# Patient Record
Sex: Female | Born: 1970 | Race: White | Hispanic: No | Marital: Single | Smoking: Former smoker
Health system: Southern US, Community
[De-identification: ages and names within clinical notes are randomized; demographics above are authoritative.]

## PROBLEM LIST (undated history)

## (undated) DIAGNOSIS — T783XXA Angioneurotic edema, initial encounter: Secondary | ICD-10-CM

## (undated) DIAGNOSIS — J302 Other seasonal allergic rhinitis: Secondary | ICD-10-CM

## (undated) DIAGNOSIS — J45909 Unspecified asthma, uncomplicated: Secondary | ICD-10-CM

## (undated) HISTORY — PX: TONSILLECTOMY: SUR1361

## (undated) HISTORY — PX: ABDOMINAL HYSTERECTOMY: SHX81

## (undated) HISTORY — DX: Angioneurotic edema, initial encounter: T78.3XXA

## (undated) HISTORY — PX: SHOULDER SURGERY: SHX246

## (undated) HISTORY — PX: ADENOIDECTOMY: SUR15

## (undated) HISTORY — PX: WISDOM TOOTH EXTRACTION: SHX21

---

## 2014-08-13 ENCOUNTER — Emergency Department (HOSPITAL_BASED_OUTPATIENT_CLINIC_OR_DEPARTMENT_OTHER)
Admission: EM | Admit: 2014-08-13 | Discharge: 2014-08-13 | Disposition: A | Discharge status: Home / Self Care | Payer: BLUE CROSS/BLUE SHIELD | Attending: Emergency Medicine | Admitting: Emergency Medicine

## 2014-08-13 ENCOUNTER — Encounter (HOSPITAL_BASED_OUTPATIENT_CLINIC_OR_DEPARTMENT_OTHER): Payer: Self-pay | Admitting: *Deleted

## 2014-08-13 DIAGNOSIS — N390 Urinary tract infection, site not specified: Secondary | ICD-10-CM | POA: Insufficient documentation

## 2014-08-13 DIAGNOSIS — Z720 Tobacco use: Secondary | ICD-10-CM | POA: Diagnosis not present

## 2014-08-13 DIAGNOSIS — R109 Unspecified abdominal pain: Secondary | ICD-10-CM | POA: Diagnosis present

## 2014-08-13 LAB — URINALYSIS, ROUTINE W REFLEX MICROSCOPIC
Bilirubin Urine: NEGATIVE
Glucose, UA: NEGATIVE mg/dL
HGB URINE DIPSTICK: NEGATIVE
Ketones, ur: NEGATIVE mg/dL
Nitrite: POSITIVE — AB
PROTEIN: NEGATIVE mg/dL
SPECIFIC GRAVITY, URINE: 1.015 (ref 1.005–1.030)
UROBILINOGEN UA: 0.2 mg/dL (ref 0.0–1.0)
pH: 7 (ref 5.0–8.0)

## 2014-08-13 LAB — URINE MICROSCOPIC-ADD ON

## 2014-08-13 MED ORDER — PHENAZOPYRIDINE HCL 200 MG PO TABS: 200.0000 mg | ORAL_TABLET | Freq: Three times a day (TID) | ORAL | Status: DC

## 2014-08-13 MED ORDER — NAPROXEN 375 MG PO TABS: 375.0000 mg | ORAL_TABLET | Freq: Two times a day (BID) | ORAL | Status: DC

## 2014-08-13 MED ORDER — NAPROXEN 250 MG PO TABS
500.0000 mg | ORAL_TABLET | Freq: Once | ORAL | Status: AC
  Administered 2014-08-13: 500 mg via ORAL
  Filled 2014-08-13: qty 2

## 2014-08-13 MED ORDER — PHENAZOPYRIDINE HCL 100 MG PO TABS
200.0000 mg | ORAL_TABLET | Freq: Once | ORAL | Status: AC
  Administered 2014-08-13: 200 mg via ORAL
  Filled 2014-08-13: qty 2

## 2014-08-13 MED ORDER — NITROFURANTOIN MONOHYD MACRO 100 MG PO CAPS: 100.0000 mg | ORAL_CAPSULE | Freq: Two times a day (BID) | ORAL | Status: DC

## 2014-08-13 MED ORDER — NITROFURANTOIN MONOHYD MACRO 100 MG PO CAPS
100.0000 mg | ORAL_CAPSULE | Freq: Once | ORAL | Status: AC
  Administered 2014-08-13: 100 mg via ORAL
  Filled 2014-08-13: qty 1

## 2014-08-13 NOTE — ED Provider Notes (Signed)
CSN: [id number]     Arrival date & time 08/13/14  2146 History  This chart was scribed for Giavana Rooke, MD by Annye AsaAnna Dorsett, ED Scribe. This patient was seen in room MH10/MH10 and the patient's care was started at 11:26 PM.    Chief Complaint  Patient presents with  . Flank Pain   Patient is a 44 y.o. female presenting with flank pain. The history is provided by the patient. No language interpreter was used.  Flank Pain This is a new problem. The current episode started 12 to 24 hours ago. The problem occurs rarely. The problem has not changed since onset.Pertinent negatives include no chest pain, no abdominal pain, no headaches and no shortness of breath. Nothing aggravates the symptoms. Nothing relieves the symptoms. She has tried nothing for the symptoms. The treatment provided no relief.     HPI Comments: Heather Weiss is a 44 y.o. female who presents to the Emergency Department complaining of 1 day left flank pain (points to LL back at posterior iliac crest) beginning today. She reports a malodorous scent to her urine, described as "protein-like." She denies any dysuria, hematuria or increased frequency. She denies any vaginal discharge.    She does not wear contacts.   History reviewed. No pertinent past medical history. Past Surgical History  Procedure Laterality Date  . Abdominal hysterectomy    . Tonsillectomy     No family history on file. History  Substance Use Topics  . Smoking status: Current Every Day Smoker -- 1.00 packs/day    Types: Cigarettes  . Smokeless tobacco: Not on file  . Alcohol Use: No   OB History    No data available     Review of Systems  Constitutional: Negative for fever.  Respiratory: Negative for shortness of breath.   Cardiovascular: Negative for chest pain.  Gastrointestinal: Negative for vomiting, abdominal pain and constipation.  Genitourinary: Positive for flank pain. Negative for dysuria, frequency, hematuria and vaginal discharge.   Neurological: Negative for headaches.  All other systems reviewed and are negative.  Allergies  Cyclinex and Sulfa antibiotics  Home Medications   Prior to Admission medications   Not on File   BP 139/74 mmHg  Pulse 92  Temp(Src) 98.8 F (37.1 C) (Oral)  Resp 20  Ht 5\' 5"  (1.651 m)  Wt 179 lb (81.194 kg)  BMI 29.79 kg/m2  SpO2 98% Physical Exam  Constitutional: She is oriented to person, place, and time. She appears well-developed and well-nourished. No distress.  Well appearing  HENT:  Head: Normocephalic and atraumatic.  Mouth/Throat: Oropharynx is clear and moist. No oropharyngeal exudate.  Moist mucous membranes  Eyes: EOM are normal. Pupils are equal, round, and reactive to light.  Neck: Normal range of motion. Neck supple. No JVD present.  Cardiovascular: Normal rate, regular rhythm and normal heart sounds.  Exam reveals no gallop and no friction rub.   No murmur heard. Pulmonary/Chest: Effort normal and breath sounds normal. No respiratory distress. She has no wheezes. She has no rales.  Abdominal: Soft. Bowel sounds are normal. She exhibits no mass. There is no tenderness. There is no rebound and no guarding.  Musculoskeletal: Normal range of motion. She exhibits no edema.  Moves all extremities normally.   Lymphadenopathy:    She has no cervical adenopathy.  Neurological: She is alert and oriented to person, place, and time. She has normal reflexes. She displays normal reflexes.  Skin: Skin is warm and dry. No rash noted.  Psychiatric: She  has a normal mood and affect. Her behavior is normal.  Nursing note and vitals reviewed.   ED Course  Procedures   DIAGNOSTIC STUDIES: Oxygen Saturation is 98% on RA, normal by my interpretation.    COORDINATION OF CARE: 11:29 PM Discussed treatment plan with pt at bedside and pt agreed to plan.   Labs Review Labs Reviewed  URINALYSIS, ROUTINE W REFLEX MICROSCOPIC - Abnormal; Notable for the following:     APPearance CLOUDY (*)    Nitrite POSITIVE (*)    Leukocytes, UA MODERATE (*)    All other components within normal limits  URINE MICROSCOPIC-ADD ON - Abnormal; Notable for the following:    Squamous Epithelial / LPF FEW (*)    Bacteria, UA MANY (*)    All other components within normal limits    Imaging Review No results found.   EKG Interpretation None      MDM   Final diagnoses:  None   Urine is consistent with UTI. Will treat with 7 days of macrobid given it's side effect profile and low resistance in the community.  Pyridium for bladder spasm and pain medication.  Strict return precautions for fever, intractable vomiting, inability to tolerate orals or any concerns.    I personally performed the services described in this documentation, which was scribed in my presence. The recorded information has been reviewed and is accurate.       Cy Blamer, MD 08/14/14 [phone]

## 2014-08-13 NOTE — ED Notes (Signed)
Left flank pain and nausea. Sore throat. Cough.

## 2014-08-14 ENCOUNTER — Encounter (HOSPITAL_BASED_OUTPATIENT_CLINIC_OR_DEPARTMENT_OTHER): Payer: Self-pay | Admitting: Emergency Medicine

## 2015-03-12 ENCOUNTER — Emergency Department (HOSPITAL_BASED_OUTPATIENT_CLINIC_OR_DEPARTMENT_OTHER)
Admission: EM | Admit: 2015-03-12 | Discharge: 2015-03-12 | Disposition: A | Discharge status: Home / Self Care | Payer: BLUE CROSS/BLUE SHIELD | Attending: Emergency Medicine | Admitting: Emergency Medicine

## 2015-03-12 ENCOUNTER — Encounter (HOSPITAL_BASED_OUTPATIENT_CLINIC_OR_DEPARTMENT_OTHER): Payer: Self-pay

## 2015-03-12 DIAGNOSIS — J45909 Unspecified asthma, uncomplicated: Secondary | ICD-10-CM | POA: Insufficient documentation

## 2015-03-12 DIAGNOSIS — L03211 Cellulitis of face: Secondary | ICD-10-CM | POA: Diagnosis not present

## 2015-03-12 DIAGNOSIS — F17210 Nicotine dependence, cigarettes, uncomplicated: Secondary | ICD-10-CM | POA: Diagnosis not present

## 2015-03-12 DIAGNOSIS — Z79899 Other long term (current) drug therapy: Secondary | ICD-10-CM | POA: Insufficient documentation

## 2015-03-12 DIAGNOSIS — R51 Headache: Secondary | ICD-10-CM | POA: Diagnosis present

## 2015-03-12 HISTORY — DX: Other seasonal allergic rhinitis: J30.2

## 2015-03-12 HISTORY — DX: Unspecified asthma, uncomplicated: J45.909

## 2015-03-12 MED ORDER — CEPHALEXIN 500 MG PO CAPS: 500.0000 mg | ORAL_CAPSULE | Freq: Four times a day (QID) | ORAL | Status: DC

## 2015-03-12 MED ORDER — CYCLOBENZAPRINE HCL 10 MG PO TABS: 10.0000 mg | ORAL_TABLET | Freq: Two times a day (BID) | ORAL | Status: DC | PRN

## 2015-03-12 MED ORDER — OXYCODONE-ACETAMINOPHEN 5-325 MG PO TABS: 2.0000 | ORAL_TABLET | ORAL | Status: DC | PRN

## 2015-03-12 MED ORDER — TRAMADOL HCL 50 MG PO TABS: 50.0000 mg | ORAL_TABLET | Freq: Four times a day (QID) | ORAL | Status: DC | PRN

## 2015-03-12 NOTE — Discharge Instructions (Signed)
Cellulitis Cellulitis is an infection of the skin and the tissue beneath it. The infected area is usually red and tender. Cellulitis occurs most often in the arms and lower legs.  CAUSES  Cellulitis is caused by bacteria that enter the skin through cracks or cuts in the skin. The most common types of bacteria that cause cellulitis are staphylococci and streptococci. SIGNS AND SYMPTOMS   Redness and warmth.  Swelling.  Tenderness or pain.  Fever. DIAGNOSIS  Your health care provider can usually determine what is wrong based on a physical exam. Blood tests may also be done. TREATMENT  Treatment usually involves taking an antibiotic medicine. HOME CARE INSTRUCTIONS   Take your antibiotic medicine as directed by your health care provider. Finish the antibiotic even if you start to feel better.  Keep the infected arm or leg elevated to reduce swelling.  Apply a warm cloth to the affected area up to 4 times per day to relieve pain.  Take medicines only as directed by your health care provider.  Keep all follow-up visits as directed by your health care provider. SEEK MEDICAL CARE IF:   You notice red streaks coming from the infected area.  Your red area gets larger or turns dark in color.  Your bone or joint underneath the infected area becomes painful after the skin has healed.  Your infection returns in the same area or another area.  You notice a swollen bump in the infected area.  You develop new symptoms.  You have a fever. SEEK IMMEDIATE MEDICAL CARE IF:   You feel very sleepy.  You develop vomiting or diarrhea.  You have a general ill feeling (malaise) with muscle aches and pains.   This information is not intended to replace advice given to you by your health care provider. Make sure you discuss any questions you have with your health care provider.  Follow up with your primary care provider if symptoms do not improve. Take antibiotics as prescribed. Encourage  warm compresses. Return to the ED if you experience worsening of your pain, spread of the redness or warmth, increased facial swelling.

## 2015-03-12 NOTE — ED Notes (Signed)
Nurse aware of BP 

## 2015-03-12 NOTE — ED Notes (Signed)
Reports right sided facial pain that started 3 days ago.  Some swelling noted.  Reports no dental pain.

## 2015-03-13 NOTE — ED Provider Notes (Signed)
CSN: [id number]     Arrival date & time 03/12/15  1717 History   First MD Initiated Contact with Patient 03/12/15 1842     Chief Complaint  Patient presents with  . Facial Pain     (Consider location/radiation/quality/duration/timing/severity/associated sxs/prior Treatment) HPI   Heather Weiss is a 44 y.o F with no significant pmhx who presents to the ED today c/o R sided facial pain. Pt states that 3 days ago she noticed a pimple on the R side of her face on her cheek bone that she attempted to pop. Since then the area has become very swollen and red. She now has pain extending to her R ear, R jaw and R side of her nose. No active drainage. She has been applying warm compresses and alcohol with no relief. Denies fever, chills, pain with eye movement, trismus, dental pain, odynophagia, vomiting, otalgia.   Past Medical History  Diagnosis Date  . Asthma   . Seasonal allergies    Past Surgical History  Procedure Laterality Date  . Abdominal hysterectomy    . Tonsillectomy     No family history on file. Social History  Substance Use Topics  . Smoking status: Current Every Day Smoker -- 1.00 packs/day    Types: Cigarettes  . Smokeless tobacco: None  . Alcohol Use: No   OB History    No data available     Review of Systems  All other systems reviewed and are negative.     Allergies  Cyclinex and Sulfa antibiotics  Home Medications   Prior to Admission medications   Medication Sig Start Date End Date Taking? Authorizing Provider  cephALEXin (KEFLEX) 500 MG capsule Take 1 capsule (500 mg total) by mouth 4 (four) times daily. 03/12/15   Samantha Tripp Dowless, PA-C  cyclobenzaprine (FLEXERIL) 10 MG tablet Take 1 tablet (10 mg total) by mouth 2 (two) times daily as needed for muscle spasms. 03/12/15   Samantha Tripp Dowless, PA-C  naproxen (NAPROSYN) 375 MG tablet Take 1 tablet (375 mg total) by mouth 2 (two) times daily. 08/13/14   April Palumbo, MD  nitrofurantoin,  macrocrystal-monohydrate, (MACROBID) 100 MG capsule Take 1 capsule (100 mg total) by mouth 2 (two) times daily. X 7 days 08/13/14   April Palumbo, MD  oxyCODONE-acetaminophen (PERCOCET/ROXICET) 5-325 MG tablet Take 2 tablets by mouth every 4 (four) hours as needed for severe pain. 03/12/15   Samantha Tripp Dowless, PA-C  phenazopyridine (PYRIDIUM) 200 MG tablet Take 1 tablet (200 mg total) by mouth 3 (three) times daily. 08/13/14   April Palumbo, MD  traMADol (ULTRAM) 50 MG tablet Take 1 tablet (50 mg total) by mouth every 6 (six) hours as needed. 03/12/15   Samantha Tripp Dowless, PA-C   BP 110/48 mmHg  Pulse 65  Temp(Src) 98.4 F (36.9 C) (Oral)  Resp 20  Ht '5\' 5"'  (1.651 m)  Wt 83.915 kg  BMI 30.79 kg/m2  SpO2 100% Physical Exam  Constitutional: She is oriented to person, place, and time. She appears well-developed and well-nourished. No distress.  HENT:  Head: Normocephalic and atraumatic.  Mouth/Throat: Oropharynx is clear and moist. No oropharyngeal exudate.  TM clear bilaterally.   2cm area of red, indurated tissue on R cheek bone. No fluctuance. No open sore or active drainage. No warmth. TTP over R cheek bone.  No dental abscess. No trismus. No dental caries. Uvula midline. No swelling under tongue.   Eyes: Conjunctivae are normal. Right eye exhibits no discharge. Left eye exhibits no  discharge. No scleral icterus.  Neck: Neck supple.  Cardiovascular: Normal rate.   Pulmonary/Chest: Effort normal.  Lymphadenopathy:    She has no cervical adenopathy.  Neurological: She is alert and oriented to person, place, and time. Coordination normal.  Skin: Skin is warm and dry. No rash noted. She is not diaphoretic. No erythema. No pallor.  Psychiatric: She has a normal mood and affect. Her behavior is normal.  Nursing note and vitals reviewed.   ED Course  Procedures (including critical care time) Labs Review Labs Reviewed - No data to display  Imaging Review No results found. I  have personally reviewed and evaluated these images and lab results as part of my medical decision-making.   EKG Interpretation None      MDM   Final diagnoses:  Cellulitis of face    Pt presents with cellulitis on face. Bedside US performed, no obvious fluid collection, no drainable abscess. Pt afebrile. No SIRS criteria met. Will d/c home with abx. Follow up with PCP. Return precautions outlined in patient discharge instructions.   Patient was discussed with and seen by Dr. Jeneen Rinks who agrees with the treatment plan.       Dondra Spry [place], PA-C 03/13/15 [place], MD 03/25/15 [phone]

## 2015-08-02 ENCOUNTER — Emergency Department (HOSPITAL_BASED_OUTPATIENT_CLINIC_OR_DEPARTMENT_OTHER)
Admission: EM | Admit: 2015-08-02 | Discharge: 2015-08-02 | Disposition: A | Discharge status: Home / Self Care | Payer: BLUE CROSS/BLUE SHIELD | Attending: Emergency Medicine | Admitting: Emergency Medicine

## 2015-08-02 ENCOUNTER — Emergency Department (HOSPITAL_BASED_OUTPATIENT_CLINIC_OR_DEPARTMENT_OTHER): Payer: BLUE CROSS/BLUE SHIELD

## 2015-08-02 ENCOUNTER — Encounter (HOSPITAL_BASED_OUTPATIENT_CLINIC_OR_DEPARTMENT_OTHER): Payer: Self-pay | Admitting: *Deleted

## 2015-08-02 DIAGNOSIS — F17210 Nicotine dependence, cigarettes, uncomplicated: Secondary | ICD-10-CM | POA: Insufficient documentation

## 2015-08-02 DIAGNOSIS — Z79899 Other long term (current) drug therapy: Secondary | ICD-10-CM | POA: Insufficient documentation

## 2015-08-02 DIAGNOSIS — J45909 Unspecified asthma, uncomplicated: Secondary | ICD-10-CM | POA: Insufficient documentation

## 2015-08-02 DIAGNOSIS — R05 Cough: Secondary | ICD-10-CM | POA: Diagnosis present

## 2015-08-02 DIAGNOSIS — J069 Acute upper respiratory infection, unspecified: Secondary | ICD-10-CM

## 2015-08-02 MED ORDER — AZITHROMYCIN 250 MG PO TABS: 250.0000 mg | ORAL_TABLET | Freq: Every day | ORAL | Status: DC

## 2015-08-02 MED ORDER — ALBUTEROL SULFATE HFA 108 (90 BASE) MCG/ACT IN AERS: 1.0000 | INHALATION_SPRAY | Freq: Four times a day (QID) | RESPIRATORY_TRACT | Status: DC | PRN

## 2015-08-02 MED ORDER — BENZONATATE 100 MG PO CAPS: 100.0000 mg | ORAL_CAPSULE | Freq: Three times a day (TID) | ORAL | Status: DC

## 2015-08-02 NOTE — ED Provider Notes (Signed)
CSN: [id number]     Arrival date & time 08/02/15  1455 History   First MD Initiated Contact with Patient 08/02/15 1556     Chief Complaint  Patient presents with  . Cough  . Sore Throat    (Consider location/radiation/quality/duration/timing/severity/associated sxs/prior Treatment) Patient is a 45 y.o. female presenting with cough and pharyngitis. The history is provided by the patient and medical records. No language interpreter was used.  Cough Associated symptoms: shortness of breath, sore throat and wheezing   Associated symptoms: no chest pain, no chills, no headaches, no myalgias and no rash   Sore Throat Associated symptoms include congestion, coughing and a sore throat. Pertinent negatives include no abdominal pain, chest pain, chills, headaches, myalgias, nausea, neck pain, rash or vomiting.   Heather Weiss is a 45 y.o. female  with a PMH of asthma who presents to the Emergency Department complaining of productive cough 2-3 days. Associated symptoms include nasal congestion, shortness of breath with exertion, sore throat. Unsure about fever. States that she was wheezing yesterday, no wheezing today. Albuterol inhaler taken with little relief-of note, patient states she received this inhaler back in 2013 and is unsure if it is still effective. No other medications taken prior to arrival. Patient is a current every day smoker. Denies chest pain, abdominal pain. No aggravating or alleviating factors noted.   Past Medical History  Diagnosis Date  . Asthma   . Seasonal allergies    Past Surgical History  Procedure Laterality Date  . Abdominal hysterectomy    . Tonsillectomy     No family history on file. Social History  Substance Use Topics  . Smoking status: Current Every Day Smoker -- 1.00 packs/day    Types: Cigarettes  . Smokeless tobacco: Never Used  . Alcohol Use: No   OB History    No data available     Review of Systems  Constitutional: Negative for chills.   HENT: Positive for congestion and sore throat.   Eyes: Negative for visual disturbance.  Respiratory: Positive for cough, shortness of breath and wheezing.   Cardiovascular: Negative.  Negative for chest pain.  Gastrointestinal: Negative for nausea, vomiting and abdominal pain.  Genitourinary: Negative for dysuria.  Musculoskeletal: Negative for myalgias and neck pain.  Skin: Negative for rash.  Neurological: Negative for dizziness and headaches.      Allergies  Cyclinex and Sulfa antibiotics  Home Medications   Prior to Admission medications   Medication Sig Start Date End Date Taking? Authorizing Provider  diphenhydrAMINE (SOMINEX) 25 MG tablet Take 25 mg by mouth at bedtime as needed for sleep.   Yes Historical Provider, MD  albuterol (PROVENTIL HFA;VENTOLIN HFA) 108 (90 Base) MCG/ACT inhaler Inhale 1-2 puffs into the lungs every 6 (six) hours as needed for wheezing or shortness of breath. 08/02/15   Chase Picket Jonathon Castelo, PA-C  azithromycin (ZITHROMAX) 250 MG tablet Take 1 tablet (250 mg total) by mouth daily. Take first 2 tablets together, then 1 every day until finished. 08/02/15   Froilan Mclean Pilcher Riyaan Heroux, PA-C  benzonatate (TESSALON) 100 MG capsule Take 1 capsule (100 mg total) by mouth every 8 (eight) hours. 08/02/15   Chase Picket Blease Capaldi, PA-C  cephALEXin (KEFLEX) 500 MG capsule Take 1 capsule (500 mg total) by mouth 4 (four) times daily. 03/12/15   Samantha Tripp Dowless, PA-C  cyclobenzaprine (FLEXERIL) 10 MG tablet Take 1 tablet (10 mg total) by mouth 2 (two) times daily as needed for muscle spasms. 03/12/15   Dub Mikes,  PA-C  naproxen (NAPROSYN) 375 MG tablet Take 1 tablet (375 mg total) by mouth 2 (two) times daily. 08/13/14   April Palumbo, MD  nitrofurantoin, macrocrystal-monohydrate, (MACROBID) 100 MG capsule Take 1 capsule (100 mg total) by mouth 2 (two) times daily. X 7 days 08/13/14   April Palumbo, MD  oxyCODONE-acetaminophen (PERCOCET/ROXICET) 5-325 MG tablet Take 2  tablets by mouth every 4 (four) hours as needed for severe pain. 03/12/15   Samantha Tripp Dowless, PA-C  phenazopyridine (PYRIDIUM) 200 MG tablet Take 1 tablet (200 mg total) by mouth 3 (three) times daily. 08/13/14   April Palumbo, MD  traMADol (ULTRAM) 50 MG tablet Take 1 tablet (50 mg total) by mouth every 6 (six) hours as needed. 03/12/15   Samantha Tripp Dowless, PA-C   BP 113/57 mmHg  Pulse 87  Temp(Src) 99.2 F (37.3 C)  Resp 18  Ht 5\' 5"  (1.651 m)  Wt 84.879 kg  BMI 31.14 kg/m2  SpO2 100% Physical Exam  Constitutional: She is oriented to person, place, and time. She appears well-developed and well-nourished. No distress.  Alert and in no acute distress  HENT:  Head: Normocephalic and atraumatic.  OP with erythema, no exudates or tonsillar hypertrophy. + nasal congestion with mucosal edema.   Neck: Normal range of motion. Neck supple.  No meningeal signs.   Cardiovascular: Normal rate, regular rhythm, normal heart sounds and intact distal pulses.  Exam reveals no gallop and no friction rub.   No murmur heard. Pulmonary/Chest: Effort normal and breath sounds normal. No respiratory distress. She has no wheezes. She has no rales. She exhibits no tenderness.  Lungs are clear to auscultation bilaterally - no w/r/r  Abdominal: Soft. Bowel sounds are normal. She exhibits no distension and no mass. There is no tenderness. There is no rebound and no guarding.  Musculoskeletal: Normal range of motion. She exhibits no edema.  Neurological: She is alert and oriented to person, place, and time.  Skin: Skin is warm and dry. She is not diaphoretic.  Nursing note and vitals reviewed.  ED Course  Procedures (including critical care time) Labs Review Labs Reviewed - No data to display  Imaging Review Dg Chest 2 View  08/02/2015  CLINICAL DATA:  Productive cough, wheezing, shortness of breath, fever and chest heaviness 2 days. EXAM: CHEST  2 VIEW COMPARISON:  None. FINDINGS: Lungs are  adequately inflated and otherwise clear. Cardiomediastinal silhouette is within normal. There are mild degenerative changes of the spine. IMPRESSION: No active cardiopulmonary disease. Electronically Signed   By: Elberta Fortisaniel  Boyle M.D.   On: 08/02/2015 16:22   I have personally reviewed and evaluated these images and lab results as part of my medical decision-making.   EKG Interpretation None      MDM   Final diagnoses:  URI (upper respiratory infection)   Heather Weiss presents to ED for cough/congestion/sob. History of asthma and current every day smoker. Chest x-ray obtained which was unremarkable. On exam, oropharynx with erythema, but no exudates or hypertrophy. Temp of 99.2. Other vitals are reassuring. Given smoking status, will go ahead and treat with azithromycin. Tessalon for cough, refill home albuterol inhaler. Importance of smoking cessation was discussed. PCP follow-up strongly encouraged. Return precautions given and all questions answered.  [facility] Grandview HospitalJaime Pilcher Seon Gaertner, PA-C 08/02/15 1645  Arby BarretteMarcy Pfeiffer, MD 08/02/15 [phone]

## 2015-08-02 NOTE — ED Notes (Signed)
Pt given d/c instructions as per chart. Rx x 2. Verbalizes understanding. No questions. 

## 2015-08-02 NOTE — Discharge Instructions (Signed)
1. Medications: Please take all of your antibiotics until finished! Tessalon for cough, you can use mucinex for nasal congestion as well, usual home medications 2. Treatment: rest, drink plenty of fluids, take tylenol or ibuprofen for fever control if needed, quit smoking 3. Follow Up: Please follow up with your primary doctor in 1 week if symptoms do not improve for discussion of your diagnoses and further evaluation after today's visit; Return to the ER for high fevers, difficulty breathing or other concerning symptoms

## 2015-08-02 NOTE — ED Notes (Signed)
Presents with cough and congestion for the past several days, states progressively worse, used Albuterol Inhaler yesterday, some relief.

## 2015-08-02 NOTE — ED Notes (Signed)
Having problems sleeping secondary to cough and wheezing

## 2015-08-02 NOTE — ED Notes (Signed)
Pt reports 3 days of cough, SOB, sore throat. Used inhaler yesterday without relief

## 2015-08-02 NOTE — ED Notes (Signed)
Pt reports the inhaler is older and she's not sure if it is effective

## 2016-04-29 ENCOUNTER — Emergency Department (HOSPITAL_BASED_OUTPATIENT_CLINIC_OR_DEPARTMENT_OTHER)
Admission: EM | Admit: 2016-04-29 | Discharge: 2016-04-29 | Disposition: A | Discharge status: Home / Self Care | Payer: BLUE CROSS/BLUE SHIELD | Attending: Emergency Medicine | Admitting: Emergency Medicine

## 2016-04-29 ENCOUNTER — Encounter (HOSPITAL_BASED_OUTPATIENT_CLINIC_OR_DEPARTMENT_OTHER): Payer: Self-pay | Admitting: *Deleted

## 2016-04-29 DIAGNOSIS — F17210 Nicotine dependence, cigarettes, uncomplicated: Secondary | ICD-10-CM | POA: Insufficient documentation

## 2016-04-29 DIAGNOSIS — X501XXA Overexertion from prolonged static or awkward postures, initial encounter: Secondary | ICD-10-CM | POA: Diagnosis not present

## 2016-04-29 DIAGNOSIS — Y929 Unspecified place or not applicable: Secondary | ICD-10-CM | POA: Insufficient documentation

## 2016-04-29 DIAGNOSIS — J45909 Unspecified asthma, uncomplicated: Secondary | ICD-10-CM | POA: Diagnosis not present

## 2016-04-29 DIAGNOSIS — Y999 Unspecified external cause status: Secondary | ICD-10-CM | POA: Insufficient documentation

## 2016-04-29 DIAGNOSIS — Z79899 Other long term (current) drug therapy: Secondary | ICD-10-CM | POA: Diagnosis not present

## 2016-04-29 DIAGNOSIS — S39012A Strain of muscle, fascia and tendon of lower back, initial encounter: Secondary | ICD-10-CM

## 2016-04-29 DIAGNOSIS — S3992XA Unspecified injury of lower back, initial encounter: Secondary | ICD-10-CM | POA: Diagnosis present

## 2016-04-29 DIAGNOSIS — Y9389 Activity, other specified: Secondary | ICD-10-CM | POA: Insufficient documentation

## 2016-04-29 LAB — URINALYSIS, ROUTINE W REFLEX MICROSCOPIC
Bilirubin Urine: NEGATIVE
Glucose, UA: NEGATIVE mg/dL
Hgb urine dipstick: NEGATIVE
Ketones, ur: NEGATIVE mg/dL
Leukocytes, UA: NEGATIVE
Nitrite: NEGATIVE
PH: 7 (ref 5.0–8.0)
Protein, ur: NEGATIVE mg/dL
Specific Gravity, Urine: 1.016 (ref 1.005–1.030)

## 2016-04-29 MED ORDER — METHOCARBAMOL 500 MG PO TABS: 500.0000 mg | ORAL_TABLET | Freq: Four times a day (QID) | ORAL | 0 refills | Status: DC

## 2016-04-29 NOTE — ED Triage Notes (Addendum)
Lower back pain x 3 days. Sharp pain after getting out of the shower and using her towel to dry off.

## 2016-04-29 NOTE — Discharge Instructions (Signed)
Please read and follow all provided instructions.  Your diagnoses today include:  1. Strain of lumbar region, initial encounter    Tests performed today include:  Vital signs - see below for your results today  Medications prescribed:   Robaxin (methocarbamol) - muscle relaxer medication  DO NOT drive or perform any activities that require you to be awake and alert because this medicine can make you drowsy.   Take any prescribed medications only as directed.  Home care instructions:   Follow any educational materials contained in this packet  Please rest, use ice or heat on your back for the next several days  Do not lift, push, pull anything more than 10 pounds for the next week  Follow-up instructions: Please follow-up with your primary care provider in the next 1 week for further evaluation of your symptoms.   Return instructions:  SEEK IMMEDIATE MEDICAL ATTENTION IF YOU HAVE:  New numbness, tingling, weakness, or problem with the use of your arms or legs  Severe back pain not relieved with medications  Loss control of your bowels or bladder  Increasing pain in any areas of the body (such as chest or abdominal pain)  Shortness of breath, dizziness, or fainting.   Worsening nausea (feeling sick to your stomach), vomiting, fever, or sweats  Any other emergent concerns regarding your health   Additional Information:  Your vital signs today were: BP 141/73 (BP Location: Right Arm)    Pulse 81    Temp 98.6 F (37 C) (Oral)    Resp 18    Ht 5\' 5"  (1.651 m)    Wt 85.7 kg    SpO2 100%    BMI 31.45 kg/m  If your blood pressure (BP) was elevated above 135/85 this visit, please have this repeated by your doctor within one month. --------------

## 2016-04-29 NOTE — ED Notes (Signed)
Ibuprofen or Tylenol offered to pt for pain. Pt refused.

## 2016-04-29 NOTE — ED Provider Notes (Signed)
[facility] Provider Note   CSN: [id number] Arrival date & time: 04/29/16  1429  By signing my name below, I, Rosario Adie, attest that this documentation has been prepared under the direction and in the presence of Renne Crigler, PA-C.  Electronically Signed: Rosario Adie, ED Scribe. 04/29/16. 5:56 PM.  History   Chief Complaint Chief Complaint  Patient presents with  . Back Pain   The history is provided by the patient. No language interpreter was used.    HPI Comments: Heather Weiss is a 46 y.o. female with no pertinent PMHx, who presents to the Emergency Department complaining of persistent, right-sided lower back pain beginning three days ago. She describer her pain as shooting and sharp. No radiation of pain. Pt reports that she was getting out of her shower and bent over to her dry her legs when her back pain began. No known trauma or injury to the back otherwise. Pt reports that she additionally had a brief episode of nausea yesterday, but this resolved and there has been none since. She has been taking Aleve and Ibuprofen at home with minimal relief of her pain. Her pain is exacerbated with standing and with ambulation. No h/o IVDU or epidural injections. Pt denies fever, weight loss, bowel/bladder incontinence, urgency, frequency, hematuria, dysuria, difficulty urinating, numbness, weakness, paraesthesias, or any other associated symptoms.   Past Medical History:  Diagnosis Date  . Asthma   . Seasonal allergies    There are no active problems to display for this patient.  Past Surgical History:  Procedure Laterality Date  . ABDOMINAL HYSTERECTOMY    . TONSILLECTOMY     OB History    No data available     Home Medications    Prior to Admission medications   Medication Sig Start Date End Date Taking? Authorizing Provider  albuterol (PROVENTIL HFA;VENTOLIN HFA) 108 (90 Base) MCG/ACT inhaler Inhale 1-2 puffs into the lungs every 6 (six) hours  as needed for wheezing or shortness of breath. 08/02/15  Yes Jaime Pilcher Ward, PA-C  azithromycin (ZITHROMAX) 250 MG tablet Take 1 tablet (250 mg total) by mouth daily. Take first 2 tablets together, then 1 every day until finished. 08/02/15   Jaime Pilcher Ward, PA-C  benzonatate (TESSALON) 100 MG capsule Take 1 capsule (100 mg total) by mouth every 8 (eight) hours. 08/02/15   Chase Picket Ward, PA-C  cephALEXin (KEFLEX) 500 MG capsule Take 1 capsule (500 mg total) by mouth 4 (four) times daily. 03/12/15   Samantha Tripp Dowless, PA-C  cyclobenzaprine (FLEXERIL) 10 MG tablet Take 1 tablet (10 mg total) by mouth 2 (two) times daily as needed for muscle spasms. 03/12/15   Samantha Tripp Dowless, PA-C  diphenhydrAMINE (SOMINEX) 25 MG tablet Take 25 mg by mouth at bedtime as needed for sleep.    Historical Provider, MD  naproxen (NAPROSYN) 375 MG tablet Take 1 tablet (375 mg total) by mouth 2 (two) times daily. 08/13/14   April Palumbo, MD  nitrofurantoin, macrocrystal-monohydrate, (MACROBID) 100 MG capsule Take 1 capsule (100 mg total) by mouth 2 (two) times daily. X 7 days 08/13/14   April Palumbo, MD  oxyCODONE-acetaminophen (PERCOCET/ROXICET) 5-325 MG tablet Take 2 tablets by mouth every 4 (four) hours as needed for severe pain. 03/12/15   Samantha Tripp Dowless, PA-C  phenazopyridine (PYRIDIUM) 200 MG tablet Take 1 tablet (200 mg total) by mouth 3 (three) times daily. 08/13/14   April Palumbo, MD  traMADol (ULTRAM) 50 MG tablet Take 1 tablet (  50 mg total) by mouth every 6 (six) hours as needed. 03/12/15   Samantha Tripp Dowless, PA-C   Family History No family history on file.  Social History Social History  Substance Use Topics  . Smoking status: Current Every Day Smoker    Packs/day: 1.00    Types: Cigarettes  . Smokeless tobacco: Never Used  . Alcohol use No   Allergies   Cyclinex [tetracycline] and Sulfa antibiotics  Review of Systems Review of Systems  Constitutional: Negative for  fever and unexpected weight change.  Gastrointestinal: Negative for constipation.       Negative for fecal incontinence.   Genitourinary: Negative for difficulty urinating, dysuria, flank pain, frequency, hematuria, pelvic pain, urgency, vaginal bleeding and vaginal discharge.       Negative for urinary incontinence or retention.  Musculoskeletal: Positive for back pain (right, lower) and myalgias.  Neurological: Negative for weakness and numbness.       Negative for bowel/bladder incontinence. Negative for paraesthesias.    Physical Exam Updated Vital Signs BP 141/73 (BP Location: Right Arm)   Pulse 81   Temp 98.6 F (37 C) (Oral)   Resp 18   Ht 5\' 5"  (1.651 m)   Wt 189 lb (85.7 kg)   SpO2 100%   BMI 31.45 kg/m   Physical Exam  Constitutional: She appears well-developed and well-nourished. No distress.  HENT:  Head: Normocephalic and atraumatic.  Eyes: Conjunctivae are normal.  Neck: Normal range of motion.  Cardiovascular: Normal rate.   Pulmonary/Chest: Effort normal.  Abdominal: She exhibits no distension.  Musculoskeletal: Normal range of motion.       Cervical back: Normal.       Thoracic back: Normal.       Lumbar back: She exhibits tenderness. She exhibits normal range of motion and no bony tenderness.       Back:  Neurological: She is alert.  Skin: No pallor.  Psychiatric: She has a normal mood and affect. Her behavior is normal.  Nursing note and vitals reviewed.  ED Treatments / Results  DIAGNOSTIC STUDIES: Oxygen Saturation is 100% on RA, normal by my interpretation.   COORDINATION OF CARE: 5:53 PM-Discussed next steps with pt including at home use of Advil and Ibuprofen, resting, ice/heat. Also will rx short course of muscle relaxants. Pt verbalized understanding and is agreeable with the plan.   Labs (all labs ordered are listed, but only abnormal results are displayed) Labs Reviewed  URINALYSIS, ROUTINE W REFLEX MICROSCOPIC - Abnormal; Notable for  the following:       Result Value   APPearance CLOUDY (*)    All other components within normal limits   EKG  EKG Interpretation None      Radiology No results found.  Procedures Procedures   Medications Ordered in ED Medications - No data to display  Initial Impression / Assessment and Plan / ED Course  I have reviewed the triage vital signs and the nursing notes.  Pertinent labs & imaging results that were available during my care of the patient were reviewed by me and considered in my medical decision making (see chart for details).     6:10 PM Patient seen and examined. Work-up initiated. Medications ordered.   Vital signs reviewed and are as follows: Vitals:   04/29/16 1431  BP: 141/73  Pulse: 81  Resp: 18  Temp: 98.6 F (37 C)    No red flag s/s of low back pain. Patient was counseled on back pain precautions and  told to do activity as tolerated but do not lift, push, or pull heavy objects more than 10 pounds for the next week.  Patient counseled to use ice or heat on back for no longer than 15 minutes every hour.   Patient counseled on proper use of muscle relaxant medication.  They were told not to drink alcohol, drive any vehicle, or do any dangerous activities while taking this medication.  Patient verbalized understanding.  Patient urged to follow-up with PCP if pain does not improve with treatment and rest or if pain becomes recurrent. Urged to return with worsening severe pain, loss of bowel or bladder control, trouble walking.   The patient verbalizes understanding and agrees with the plan.    Final Clinical Impressions(s) / ED Diagnoses   Final diagnoses:  Strain of lumbar region, initial encounter   Patient with back pain. No neurological deficits. Patient is ambulatory. No warning symptoms of back pain including: fecal incontinence, urinary retention or overflow incontinence, night sweats, waking from sleep with back pain, unexplained fevers or  weight loss, h/o cancer, IVDU, recent trauma. No concern for cauda equina, epidural abscess, or other serious cause of back pain. Conservative measures such as rest, ice/heat and pain medicine indicated with PCP follow-up if no improvement with conservative management.   New Prescriptions New Prescriptions   METHOCARBAMOL (ROBAXIN) 500 MG TABLET    Take 1 tablet (500 mg total) by mouth 4 (four) times daily.   I personally performed the services described in this documentation, which was scribed in my presence. The recorded information has been reviewed and is accurate.     Renne CriglerJoshua Alyss Granato, PA-C 04/29/16 1811    Canary Brimhristopher J Tegeler, MD 04/30/16 [phone]

## 2016-10-21 ENCOUNTER — Emergency Department (HOSPITAL_BASED_OUTPATIENT_CLINIC_OR_DEPARTMENT_OTHER): Payer: BLUE CROSS/BLUE SHIELD

## 2016-10-21 ENCOUNTER — Encounter (HOSPITAL_BASED_OUTPATIENT_CLINIC_OR_DEPARTMENT_OTHER): Payer: Self-pay | Admitting: *Deleted

## 2016-10-21 ENCOUNTER — Emergency Department (HOSPITAL_BASED_OUTPATIENT_CLINIC_OR_DEPARTMENT_OTHER)
Admission: EM | Admit: 2016-10-21 | Discharge: 2016-10-21 | Disposition: A | Discharge status: Home / Self Care | Payer: BLUE CROSS/BLUE SHIELD | Attending: Emergency Medicine | Admitting: Emergency Medicine

## 2016-10-21 DIAGNOSIS — F17210 Nicotine dependence, cigarettes, uncomplicated: Secondary | ICD-10-CM | POA: Insufficient documentation

## 2016-10-21 DIAGNOSIS — S99922A Unspecified injury of left foot, initial encounter: Secondary | ICD-10-CM | POA: Diagnosis present

## 2016-10-21 DIAGNOSIS — S92532A Displaced fracture of distal phalanx of left lesser toe(s), initial encounter for closed fracture: Secondary | ICD-10-CM

## 2016-10-21 DIAGNOSIS — Y9301 Activity, walking, marching and hiking: Secondary | ICD-10-CM | POA: Diagnosis not present

## 2016-10-21 DIAGNOSIS — Y999 Unspecified external cause status: Secondary | ICD-10-CM | POA: Diagnosis not present

## 2016-10-21 DIAGNOSIS — J45909 Unspecified asthma, uncomplicated: Secondary | ICD-10-CM | POA: Diagnosis not present

## 2016-10-21 DIAGNOSIS — Y929 Unspecified place or not applicable: Secondary | ICD-10-CM | POA: Diagnosis not present

## 2016-10-21 DIAGNOSIS — X58XXXA Exposure to other specified factors, initial encounter: Secondary | ICD-10-CM | POA: Insufficient documentation

## 2016-10-21 MED ORDER — MELOXICAM 7.5 MG PO TABS: 7.5000 mg | ORAL_TABLET | Freq: Every day | ORAL | 0 refills | Status: DC

## 2016-10-21 MED ORDER — MELOXICAM 7.5 MG PO TABS
7.5000 mg | ORAL_TABLET | Freq: Once | ORAL | Status: DC
Start: 2016-10-21 — End: 2016-10-21
  Filled 2016-10-21: qty 1

## 2016-10-21 MED ORDER — NAPROXEN 250 MG PO TABS
500.0000 mg | ORAL_TABLET | Freq: Once | ORAL | Status: AC
  Administered 2016-10-21: 500 mg via ORAL
  Filled 2016-10-21: qty 2

## 2016-10-21 MED FILL — MELOXICAM 7.5 MG TABLET: 7.5 | 20 days supply | Qty: 20 | Fill #0

## 2016-10-21 NOTE — Discharge Instructions (Signed)
Take Mobic once a day for pain. Do not take other anti-inflammatories at the same time (Advil, ibuprofen, Motrin, Aleve, naproxen). You may supplement with Tylenol as needed.  Try to rest her foot as much as possible. Use ice at least several times a day, 20 minutes at a time. Try to keep your foot elevated.  You may follow up with primary care if her symptoms are not improving. Return to the emergency department if you're unable to feel the end of your foot, or feeling of any new or worsening symptoms.

## 2016-10-21 NOTE — ED Triage Notes (Signed)
Pt c/o left 4th toe injury x 1 day

## 2016-10-21 NOTE — ED Provider Notes (Signed)
[facility]   CSN: [id number] Arrival date & time: 10/21/16  1340     History   Chief Complaint Chief Complaint  Patient presents with  . Toe Pain    HPI Heather Weiss is a 46 y.o. female presenting with 1 day L toe pain.   Pt states that she jammed her toe into her son's foot yesterday. She had acute onset pain, which has felt like throbbing ever since. She denies injury elsewhere. She is ambulatory with pain. She took motrin last night with minimal relief. She denies numbness of tingling of the toe or foot. She denies prior injury of this foot. She reports the pain is worse with walking and touching it, but is lessened with rest. She denies other medical conditions, and takes no other medications daily. She states she does have a PCP, but told the RN she does not.   HPI  Past Medical History:  Diagnosis Date  . Asthma   . Seasonal allergies     There are no active problems to display for this patient.   Past Surgical History:  Procedure Laterality Date  . ABDOMINAL HYSTERECTOMY    . TONSILLECTOMY      OB History    No data available       Home Medications    Prior to Admission medications   Medication Sig Start Date End Date Taking? Authorizing Provider  albuterol (PROVENTIL HFA;VENTOLIN HFA) 108 (90 Base) MCG/ACT inhaler Inhale 1-2 puffs into the lungs every 6 (six) hours as needed for wheezing or shortness of breath. 08/02/15   Ward, Chase PicketJaime Pilcher, PA-C  meloxicam (MOBIC) 7.5 MG tablet Take 1 tablet (7.5 mg total) by mouth daily. 10/21/16   Kearie Mennen, PA-C    Family History History reviewed. No pertinent family history.  Social History Social History  Substance Use Topics  . Smoking status: Current Every Day Smoker    Packs/day: 1.00    Types: Cigarettes  . Smokeless tobacco: Never Used  . Alcohol use No     Allergies   Cyclinex [tetracycline] and Sulfa antibiotics   Review of Systems Review of Systems    Musculoskeletal: Positive for arthralgias.  Skin: Negative for wound.  Neurological: Negative for numbness.     Physical Exam Updated Vital Signs BP (!) 106/57 (BP Location: Left Arm)   Pulse 67   Temp 99.1 F (37.3 C) (Oral)   Resp 14   Ht 5\' 5"  (1.651 m)   Wt 83.9 kg (185 lb)   SpO2 98%   BMI 30.79 kg/m   Physical Exam  Constitutional: She is oriented to person, place, and time. She appears well-developed and well-nourished. No distress.  HENT:  Head: Normocephalic and atraumatic.  Eyes: EOM are normal.  Neck: Normal range of motion.  Cardiovascular: Intact distal pulses.   Pulmonary/Chest: Effort normal.  Abdominal: She exhibits no distension.  Musculoskeletal: Normal range of motion.  Bruising of the distal 4th toe with swelling at that site. No obvious bruising of the foot or surrounding toes. No open injury or laceration. Full active ROM of ankle and great toe without pain. TTP of the 4th MTP and distal 4th toe. Sensation of distal toe intact. Pedal pulses equal bilaterally.   Neurological: She is alert and oriented to person, place, and time. No sensory deficit.  Skin: Skin is warm. No rash noted.  Psychiatric: She has a normal mood and affect.  Nursing note and vitals reviewed.    ED Treatments /  Results  Labs (all labs ordered are listed, but only abnormal results are displayed) Labs Reviewed - No data to display  EKG  EKG Interpretation None       Radiology Dg Toe 4th Left  Result Date: 10/21/2016 CLINICAL DATA:  Left fourth toe bruising and swelling after injury yesterday. EXAM: LEFT FOURTH TOE COMPARISON:  None. FINDINGS: Mildly displaced fracture is seen involving the proximal base of the fourth distal phalanx along its dorsal surface. No other fracture or bony abnormality is noted. Joint spaces are intact. IMPRESSION: Mildly displaced fracture involving the fourth distal phalanx. Electronically Signed   By: Lupita RaiderJames  Green Jr, M.D.   On: 10/21/2016  14:07    Procedures Procedures (including critical care time)  Medications Ordered in ED Medications  meloxicam (MOBIC) tablet 7.5 mg (7.5 mg Oral Not Given 10/21/16 1459)  naproxen (NAPROSYN) tablet 500 mg (500 mg Oral Given 10/21/16 1507)     Initial Impression / Assessment and Plan / ED Course  I have reviewed the triage vital signs and the nursing notes.  Pertinent labs & imaging results that were available during my care of the patient were reviewed by me and considered in my medical decision making (see chart for details).     Pt with pain and bruising of 4th toe. Xray showed distal fx with mild displacement. Physicial exam showed sensation intact, and injury is closed without obvious displacement. Pedal pulses intact. Compartments soft. Pt neurovascularly intact. Appears safe for discharge. Discussed conservative measures including rest, ice, NSAIDs, buddy tape and post op shoe. Pt to f/u with PCP as needed for further pain control or further work note. Return precautions given. Pt states she understands and agrees to plan.   Final Clinical Impressions(s) / ED Diagnoses   Final diagnoses:  Closed displaced fracture of distal phalanx of lesser toe of left foot, initial encounter    New Prescriptions Discharge Medication List as of 10/21/2016  2:53 PM    START taking these medications   Details  meloxicam (MOBIC) 7.5 MG tablet Take 1 tablet (7.5 mg total) by mouth daily., Starting Thu 10/21/2016, Print         Norwoodaccavale, Blackwells MillsSophia, PA-C 10/21/16 1804    Phillis HaggisMabe, Martha L, MD 10/22/16 [phone]

## 2016-10-26 ENCOUNTER — Encounter: Payer: Self-pay | Admitting: Family Medicine

## 2016-10-26 ENCOUNTER — Ambulatory Visit (INDEPENDENT_AMBULATORY_CARE_PROVIDER_SITE_OTHER): Payer: BLUE CROSS/BLUE SHIELD | Admitting: Family Medicine

## 2016-10-26 DIAGNOSIS — S92502A Displaced unspecified fracture of left lesser toe(s), initial encounter for closed fracture: Secondary | ICD-10-CM

## 2016-10-26 MED ORDER — HYDROCODONE-ACETAMINOPHEN 5-325 MG PO TABS: 1.0000 | ORAL_TABLET | Freq: Four times a day (QID) | ORAL | 0 refills | Status: DC | PRN

## 2016-10-26 NOTE — Patient Instructions (Addendum)
You have a fracture of your 4th toe. These typically take 6 weeks to completely heal though people usually feel better around 4 weeks. Ice the area 15 minutes at a time 3-4 times a day. Aleve 2 tabs twice a day with food OR ibuprofen 600mg  three times a day with food for pain and inflammation. Norco as needed for severe pain (no driving on this). Call me if you feel improved prior to next visit and we can return you to work sooner. Buddy tape for comfort and use postop shoe when up and walking around. Follow up with me in 2 weeks for reevaluation.

## 2016-10-28 DIAGNOSIS — S92502A Displaced unspecified fracture of left lesser toe(s), initial encounter for closed fracture: Secondary | ICD-10-CM | POA: Insufficient documentation

## 2016-10-28 NOTE — Assessment & Plan Note (Signed)
independently reviewed radiographs and noted distal phalanx fracture of left 4th digit.  Should do well with conservative treatment.  Icing, aleve or ibuprofen with norco as needed.  Buddy taping and postop shoe.  F/u in 2 weeks for reevaluation.

## 2016-10-28 NOTE — Progress Notes (Signed)
PCP: System, Pcp Not In  Subjective:   HPI: Patient is a 46 y.o. female here for left toe injury.  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 Her foot hit her son's foot and she felt a pop/snap in lateral toes - felt mainly in 4th toe. Associated bruising and swelling. Pain is 2/10 but up to 8/10 at times and throbbing. Tried meloxicam but made her sick to stomach. Better with postop shoe on now. No other skin changes.  Past Medical History:  Diagnosis Date  . Asthma   . Seasonal allergies     Current Outpatient Prescriptions on File Prior to Visit  Medication Sig Dispense Refill  . albuterol (PROVENTIL HFA;VENTOLIN HFA) 108 (90 Base) MCG/ACT inhaler Inhale 1-2 puffs into the lungs every 6 (six) hours as needed for wheezing or shortness of breath. 1 Inhaler 0  . meloxicam (MOBIC) 7.5 MG tablet Take 1 tablet (7.5 mg total) by mouth daily. 20 tablet 0   No current facility-administered medications on file prior to visit.     Past Surgical History:  Procedure Laterality Date  . ABDOMINAL HYSTERECTOMY    . TONSILLECTOMY      Allergies  Allergen Reactions  . Cyclinex [Tetracycline]     rash  . Meloxicam   . Sulfa Antibiotics     rash    Social History   Social History  . Marital status: Single    Spouse name: N/A  . Number of children: N/A  . Years of education: N/A   Occupational History  . Not on file.   Social History Main Topics  . Smoking status: Current Every Day Smoker    Packs/day: 1.00    Types: Cigarettes  . Smokeless tobacco: Never Used  . Alcohol use No  . Drug use: No  . Sexual activity: Not on file   Other Topics Concern  . Not on file   Social History Narrative  . No narrative on file    No family history on file.  BP 120/77   Pulse 67   Ht 5\' 5"  (1.651 m)   Wt 185 lb (83.9 kg)   BMI 30.79 kg/m   Review of Systems: See HPI above.     Objective:  Physical Exam:  Gen: NAD, comfortable in exam  room  Left foot/ankle: Swelling, bruising distal 4th digit.  No other foot/ankle swelling, bruising. FROM ankle - limited motion of 4th and 5th digits. TTP throughout 4th digit.  No other tenderness. Negative ant drawer and talar tilt.   Negative syndesmotic compression. Thompsons test negative. NV intact distally.   Assessment & Plan:  1. Left toe fracture - independently reviewed radiographs and noted distal phalanx fracture of left 4th digit.  Should do well with conservative treatment.  Icing, aleve or ibuprofen with norco as needed.  Buddy taping and postop shoe.  F/u in 2 weeks for reevaluation.

## 2016-11-09 ENCOUNTER — Ambulatory Visit (INDEPENDENT_AMBULATORY_CARE_PROVIDER_SITE_OTHER): Payer: BLUE CROSS/BLUE SHIELD | Admitting: Family Medicine

## 2016-11-09 ENCOUNTER — Encounter: Payer: Self-pay | Admitting: Family Medicine

## 2016-11-09 DIAGNOSIS — S92502D Displaced unspecified fracture of left lesser toe(s), subsequent encounter for fracture with routine healing: Secondary | ICD-10-CM

## 2016-11-09 NOTE — Progress Notes (Signed)
PCP: System, Pcp Not In  Subjective:   HPI: Patient is a 46 y.o. female here for left toe injury.  8/7: Patient reports on 8/1 she was moving a dresser when wearing flip flops. Her foot hit her son's foot and she felt a pop/snap in lateral toes - felt mainly in 4th toe. Associated bruising and swelling. Pain is 2/10 but up to 8/10 at times and throbbing. Tried meloxicam but made her sick to stomach. Better with postop shoe on now. No other skin changes.  8/21: Patient returns with continued pain but improvement of left 4th digit fracture. Buddy taping and using postop shoe. Pain level 2/10, still sharp. Has been icing, taking motrin. No benefit with pain medicine. No skin changes.  Past Medical History:  Diagnosis Date  . Asthma   . Seasonal allergies     Current Outpatient Prescriptions on File Prior to Visit  Medication Sig Dispense Refill  . albuterol (PROVENTIL HFA;VENTOLIN HFA) 108 (90 Base) MCG/ACT inhaler Inhale 1-2 puffs into the lungs every 6 (six) hours as needed for wheezing or shortness of breath. 1 Inhaler 0  . HYDROcodone-acetaminophen (NORCO) 5-325 MG tablet Take 1 tablet by mouth every 6 (six) hours as needed for moderate pain. 20 tablet 0  . meloxicam (MOBIC) 7.5 MG tablet Take 1 tablet (7.5 mg total) by mouth daily. 20 tablet 0   No current facility-administered medications on file prior to visit.     Past Surgical History:  Procedure Laterality Date  . ABDOMINAL HYSTERECTOMY    . TONSILLECTOMY      Allergies  Allergen Reactions  . Cyclinex [Tetracycline]     rash  . Meloxicam   . Sulfa Antibiotics     rash    Social History   Social History  . Marital status: Single    Spouse name: N/A  . Number of children: N/A  . Years of education: N/A   Occupational History  . Not on file.   Social History Main Topics  . Smoking status: Current Every Day Smoker    Packs/day: 1.00    Types: Cigarettes  . Smokeless tobacco: Never Used  .  Alcohol use No  . Drug use: No  . Sexual activity: Not on file   Other Topics Concern  . Not on file   Social History Narrative  . No narrative on file    No family history on file.  BP 116/63   Pulse 64   Ht 5\' 4"  (1.626 m)   Wt 186 lb 9.6 oz (84.6 kg)   BMI 32.03 kg/m   Review of Systems: See HPI above.     Objective:  Physical Exam:  Gen: NAD, comfortable in exam room  Left foot/ankle: Swelling, bruising distal 4th digit but improved.  No other foot/ankle swelling, bruising. FROM ankle. TTP distal 4th digit.  No other tenderness. Negative ant drawer and talar tilt.   Negative syndesmotic compression. Thompsons test negative. NV intact distally.   Assessment & Plan:  1. Left toe fracture - Distal phalanx fracture of left 4th digit.  Mild improvement from last visit.  Continue with buddy taping, postop shoe for another week - switch to regular supportive shoe when comfortable.  Aleve or ibuprofen, icing.  F/u in 4 weeks.

## 2016-11-09 NOTE — Patient Instructions (Signed)
You have a fracture of your 4th toe. These typically take 6 weeks to completely heal though people usually feel better around 4 weeks. Ice the area 15 minutes at a time 3-4 times a day. Aleve 2 tabs twice a day with food OR ibuprofen 600mg  three times a day with food for pain and inflammation. Out of work for 1 more week then may return. Buddy tape for comfort until I see you back. Follow up with me in 4 weeks for reevaluation.

## 2016-11-09 NOTE — Assessment & Plan Note (Signed)
Distal phalanx fracture of left 4th digit.  Mild improvement from last visit.  Continue with buddy taping, postop shoe for another week - switch to regular supportive shoe when comfortable.  Aleve or ibuprofen, icing.  F/u in 4 weeks.

## 2016-12-07 ENCOUNTER — Ambulatory Visit (INDEPENDENT_AMBULATORY_CARE_PROVIDER_SITE_OTHER): Payer: BLUE CROSS/BLUE SHIELD | Admitting: Family Medicine

## 2016-12-07 ENCOUNTER — Ambulatory Visit (HOSPITAL_BASED_OUTPATIENT_CLINIC_OR_DEPARTMENT_OTHER)
Admission: RE | Admit: 2016-12-07 | Discharge: 2016-12-07 | Disposition: A | Discharge status: Home / Self Care | Payer: BLUE CROSS/BLUE SHIELD | Source: Ambulatory Visit | Attending: Family Medicine | Admitting: Family Medicine

## 2016-12-07 ENCOUNTER — Encounter: Payer: Self-pay | Admitting: Family Medicine

## 2016-12-07 VITALS — BP 110/79 | HR 93 | Ht 65.0 in | Wt 180.0 lb

## 2016-12-07 DIAGNOSIS — S92535G Nondisplaced fracture of distal phalanx of left lesser toe(s), subsequent encounter for fracture with delayed healing: Secondary | ICD-10-CM

## 2016-12-07 DIAGNOSIS — X58XXXD Exposure to other specified factors, subsequent encounter: Secondary | ICD-10-CM | POA: Diagnosis not present

## 2016-12-07 DIAGNOSIS — S92502D Displaced unspecified fracture of left lesser toe(s), subsequent encounter for fracture with routine healing: Secondary | ICD-10-CM

## 2016-12-07 DIAGNOSIS — S92532D Displaced fracture of distal phalanx of left lesser toe(s), subsequent encounter for fracture with routine healing: Secondary | ICD-10-CM | POA: Diagnosis not present

## 2016-12-07 NOTE — Patient Instructions (Signed)
Your x-rays look great. Icing, aleve, or ibuprofen only if needed. You don't need to buddy tape this now unless it feels more comfortable to do so. Wear supportive shoes when possible until pain is completely gone Follow up with me as needed.

## 2016-12-08 NOTE — Assessment & Plan Note (Signed)
Repeat radiographs independently reviewed and fracture of distal phalanx 4th digit appears healed.  Reassured patient.  Icing, aleve, or ibuprofen only if needed.  Supportive shoes until pain resolved.  F/u prn.

## 2016-12-08 NOTE — Progress Notes (Signed)
PCP: System, Pcp Not In  Subjective:   HPI: Patient is a 46 y.o. female here for left toe injury.  8/7: Patient reports on 8/1 she was moving a dresser when wearing flip flops. Her foot hit her son's foot and she felt a pop/snap in lateral toes - felt mainly in 4th toe. Associated bruising and swelling. Pain is 2/10 but up to 8/10 at times and throbbing. Tried meloxicam but made her sick to stomach. Better with postop shoe on now. No other skin changes.  8/21: Patient returns with continued pain but improvement of left 4th digit fracture. Buddy taping and using postop shoe. Pain level 2/10, still sharp. Has been icing, taking motrin. No benefit with pain medicine. No skin changes.  9/18: Patient reports she has improved though gets swelling into her left 4th toe especially when on feet a lot. Turned blue/black when at work especially when she started back at work. Pain level 0/10. Wakes her up at night at times. No skin changes, numbness.  Past Medical History:  Diagnosis Date  . Asthma   . Seasonal allergies     Current Outpatient Prescriptions on File Prior to Visit  Medication Sig Dispense Refill  . albuterol (PROVENTIL HFA;VENTOLIN HFA) 108 (90 Base) MCG/ACT inhaler Inhale 1-2 puffs into the lungs every 6 (six) hours as needed for wheezing or shortness of breath. 1 Inhaler 0  . HYDROcodone-acetaminophen (NORCO) 5-325 MG tablet Take 1 tablet by mouth every 6 (six) hours as needed for moderate pain. 20 tablet 0  . meloxicam (MOBIC) 7.5 MG tablet Take 1 tablet (7.5 mg total) by mouth daily. 20 tablet 0   No current facility-administered medications on file prior to visit.     Past Surgical History:  Procedure Laterality Date  . ABDOMINAL HYSTERECTOMY    . TONSILLECTOMY      Allergies  Allergen Reactions  . Cyclinex [Tetracycline]     rash  . Meloxicam   . Sulfa Antibiotics     rash    Social History   Social History  . Marital status: Single    Spouse  name: N/A  . Number of children: N/A  . Years of education: N/A   Occupational History  . Not on file.   Social History Main Topics  . Smoking status: Current Every Day Smoker    Packs/day: 1.00    Types: Cigarettes  . Smokeless tobacco: Never Used  . Alcohol use No  . Drug use: No  . Sexual activity: Not on file   Other Topics Concern  . Not on file   Social History Narrative  . No narrative on file    No family history on file.  BP 110/79   Pulse 93   Ht  (1.651 m)   Wt 180 lb (81.6 kg)   BMI 29.95 kg/m   Review of Systems: See HPI above.     Objective:  Physical Exam:  Gen: NAD, comfortable in exam room  Left foot/ankle: Mild swelling, distal 4th digit.  No other foot/ankle swelling, bruising. FROM ankle. Mild TTP distal 4th digit.  No other tenderness. Negative ant drawer and talar tilt.   Negative syndesmotic compression. Thompsons test negative. NV intact distally.   Assessment & Plan:  1. Left toe fracture - Repeat radiographs independently reviewed and fracture of distal phalanx 4th digit appears healed.  Reassured patient.  Icing, aleve, or ibuprofen only if needed.  Supportive shoes until pain resolved.  F/u prn.

## 2017-01-09 ENCOUNTER — Encounter (HOSPITAL_BASED_OUTPATIENT_CLINIC_OR_DEPARTMENT_OTHER): Payer: Self-pay | Admitting: *Deleted

## 2017-01-09 ENCOUNTER — Emergency Department (HOSPITAL_BASED_OUTPATIENT_CLINIC_OR_DEPARTMENT_OTHER): Payer: BLUE CROSS/BLUE SHIELD

## 2017-01-09 ENCOUNTER — Emergency Department (HOSPITAL_BASED_OUTPATIENT_CLINIC_OR_DEPARTMENT_OTHER)
Admission: EM | Admit: 2017-01-09 | Discharge: 2017-01-09 | Disposition: A | Discharge status: Home / Self Care | Payer: BLUE CROSS/BLUE SHIELD | Attending: Emergency Medicine | Admitting: Emergency Medicine

## 2017-01-09 DIAGNOSIS — Z79899 Other long term (current) drug therapy: Secondary | ICD-10-CM | POA: Insufficient documentation

## 2017-01-09 DIAGNOSIS — J45909 Unspecified asthma, uncomplicated: Secondary | ICD-10-CM | POA: Insufficient documentation

## 2017-01-09 DIAGNOSIS — M79671 Pain in right foot: Secondary | ICD-10-CM | POA: Diagnosis present

## 2017-01-09 DIAGNOSIS — M7712 Lateral epicondylitis, left elbow: Secondary | ICD-10-CM

## 2017-01-09 DIAGNOSIS — M25522 Pain in left elbow: Secondary | ICD-10-CM | POA: Insufficient documentation

## 2017-01-09 DIAGNOSIS — F17210 Nicotine dependence, cigarettes, uncomplicated: Secondary | ICD-10-CM | POA: Diagnosis not present

## 2017-01-09 NOTE — ED Provider Notes (Signed)
[facility] EMERGENCY DEPARTMENT Provider Note   CSN: [id number] Arrival date & time: 01/09/17  0702     History   Chief Complaint Chief Complaint  Patient presents with  . Elbow Pain  . Foot Pain    HPI Heather Weiss is a 46 y.o. female.  46 year old female with history of asthma who presents with left elbow and right foot pain. The patient has had a few months of persistent left elbow pain that is worse with movement. She has tried an elbow brace, Motrin, ibuprofen, and Aleve with no relief of her symptoms. No history of trauma. She also notes intermittent right dorsal foot swelling and throbbing pain that has been going on for several months but has been worsening and more constant recently. Also no history of trauma. Swelling seems worse at the end of the day after she has been walking at work. She has never been evaluated for these problems before.   The history is provided by the patient.  Foot Pain     Past Medical History:  Diagnosis Date  . Asthma   . Seasonal allergies     Patient Active Problem List   Diagnosis Date Noted  . Closed fracture of phalanx of left fourth toe 10/28/2016    Past Surgical History:  Procedure Laterality Date  . ABDOMINAL HYSTERECTOMY    . TONSILLECTOMY      OB History    No data available       Home Medications    Prior to Admission medications   Medication Sig Start Date End Date Taking? Authorizing Provider  albuterol (PROVENTIL HFA;VENTOLIN HFA) 108 (90 Base) MCG/ACT inhaler Inhale 1-2 puffs into the lungs every 6 (six) hours as needed for wheezing or shortness of breath. 08/02/15   Ward, Chase PicketJaime Pilcher, PA-C  HYDROcodone-acetaminophen (NORCO) 5-325 MG tablet Take 1 tablet by mouth every 6 (six) hours as needed for moderate pain. 10/26/16   Hudnall, Azucena FallenShane R, MD  meloxicam (MOBIC) 7.5 MG tablet Take 1 tablet (7.5 mg total) by mouth daily. 10/21/16   Caccavale, Sophia, PA-C    Family History No family history on  file.  Social History Social History  Substance Use Topics  . Smoking status: Current Every Day Smoker    Packs/day: 1.00    Types: Cigarettes  . Smokeless tobacco: Never Used  . Alcohol use No     Allergies   Cyclinex [tetracycline]; Meloxicam; and Sulfa antibiotics   Review of Systems Review of Systems  Constitutional: Negative for fever.  Musculoskeletal: Positive for joint swelling.  Skin: Negative for color change and wound.  Neurological: Positive for numbness.     Physical Exam Updated Vital Signs BP (!) 131/57 (BP Location: Right Arm)   Pulse 74   Temp 98.5 F (36.9 C) (Oral)   Resp 16   Ht 5\' 5"  (1.651 m)   Wt 81.6 kg (180 lb)   SpO2 99%   BMI 29.95 kg/m   Physical Exam  Constitutional: She is oriented to person, place, and time. She appears well-developed and well-nourished. No distress.  HENT:  Head: Normocephalic and atraumatic.  Eyes: Conjunctivae are normal.  Neck: Neck supple.  Cardiovascular: Intact distal pulses.   Musculoskeletal: She exhibits tenderness. She exhibits no deformity.  RIGHT FOOT: Achilles tendon intact, no proximal fibular tenderness, no base of 5th metatarsal tenderness, no midfoot instability, no medial/lateral malleolus tenderness, full ROM at ankle Tenderness along ATFL with mild swelling at proximal ligament on dorsal foot, no erythema  LEFT ELBOW: full ROM including pronation/supination, no joint swelling or effusion, mild tenderness lateral epicondyle   Neurological: She is alert and oriented to person, place, and time. No sensory deficit.  Normal sensation b/l lower extremities  Skin: Skin is warm and dry. No rash noted. No erythema.  Psychiatric: She has a normal mood and affect. Judgment normal.  Nursing note and vitals reviewed.    ED Treatments / Results  Labs (all labs ordered are listed, but only abnormal results are displayed) Labs Reviewed - No data to display  EKG  EKG Interpretation None        Radiology Dg Foot Complete Right  Result Date: 01/09/2017 CLINICAL DATA:  Subacute right foot swelling for several months. No known injury. Initial encounter. EXAM: RIGHT FOOT COMPLETE - 3+ VIEW COMPARISON:  None. FINDINGS: There is no evidence of acute fracture, subluxation or dislocation. No focal bony lesion for erosive changes noted. The Lisfranc joints are unremarkable. There may be mild dorsal soft tissue swelling present. IMPRESSION: Question mild dorsal soft tissue swelling. No bony or joint abnormalities identified. Electronically Signed   By: Harmon Pier M.D.   On: 01/09/2017 07:52    Procedures Procedures (including critical care time)  Medications Ordered in ED Medications - No data to display   Initial Impression / Assessment and Plan / ED Course  I have reviewed the triage vital signs and the nursing notes.  Pertinent  imaging results that were available during my care of the patient were reviewed by me and considered in my medical decision making (see chart for details).     Several months atraumatic L elbow and R dorsal foot pain. No signs/sx of infection or trauma. Given normal elbow exam, I do not feel XR would be of much benefit today. Suspect tennis elbow. Discussed supportive measures, offered mobic but patient states it upsets her stomach. I cautioned against heavy NSAID use, recommended switching to tylenol, but explained that I do not use narcotics for this type of problem. gave sports med f/u info. Regarding foot, area of pain appears to be along ATFL. Placed in ASO brace and also discussed supportive measures.  Final Clinical Impressions(s) / ED Diagnoses   Final diagnoses:  Lateral epicondylitis of left elbow  Foot pain, right    New Prescriptions New Prescriptions   No medications on file     Fatmata Legere, Ambrose Finland, MD 01/09/17 [phone]

## 2017-01-09 NOTE — ED Triage Notes (Signed)
Pt presents with L elbow pain since August -- denies known injury. Reports intermittent numbness and tingling and weakness. Pt has full ROM with pain. Also reports knot on the top of R foot that has come and gone for last few months. Denies known injury. Pt able to ambulate.

## 2017-01-11 ENCOUNTER — Ambulatory Visit (INDEPENDENT_AMBULATORY_CARE_PROVIDER_SITE_OTHER): Payer: BLUE CROSS/BLUE SHIELD | Admitting: Family Medicine

## 2017-01-11 ENCOUNTER — Encounter: Payer: Self-pay | Admitting: Family Medicine

## 2017-01-11 DIAGNOSIS — M7712 Lateral epicondylitis, left elbow: Secondary | ICD-10-CM | POA: Diagnosis not present

## 2017-01-11 DIAGNOSIS — M7989 Other specified soft tissue disorders: Secondary | ICD-10-CM

## 2017-01-11 MED ORDER — NAPROXEN 500 MG PO TABS: 500.0000 mg | ORAL_TABLET | Freq: Two times a day (BID) | ORAL | 2 refills | Status: DC | PRN

## 2017-01-11 MED ORDER — NITROGLYCERIN 0.2 MG/HR TD PT24: MEDICATED_PATCH | TRANSDERMAL | 1 refills | Status: DC

## 2017-01-11 NOTE — Patient Instructions (Addendum)
You have lateral epicondylitis (tennis elbow) Try to avoid painful activities as much as possible. Ice the area 3-4 times a day for 15 minutes at a time. Counterforce brace as directed can help unload area - wear this regularly if it provides you with relief. Consider wrist brace as well to rest from wrist motions that will aggravate this. Hammer rotation exercise, wrist extension exercise with 1 pound weight - 3 sets of 10 once a day.   Stretching - hold for 20-30 seconds and repeat 3 times. Start nitro patches 1/4th patch over affected elbow, change daily. Consider physical therapy, injection if not improving. Follow up in 6 weeks.  You have a synovial cyst on top of your right foot measuring 2.3x0.5x4.0cm. I would use compression for this with arch binder when up and walking around. A majority of these resolve on their own. Naproxen 500mg  twice a day with food for pain and inflammation. Tylenol 500mg  1-2 tabs three times a day as needed for pain. Topical capsaicin or biofreeze to use up to 4 times a day for this or elbow. We can consider aspiration and injection of this if it continues to be painful despite conservative measures. Icing 15 minutes at a time 3-4 times a day.

## 2017-01-13 NOTE — Progress Notes (Signed)
PCP: System, Pcp Not In  Subjective:   HPI: Patient is a 46 y.o. female here for left elbow, right foot pain.  Patient reports she's had over 2 months of lateral left elbow pain though it's been much worse in past 2 weeks. Pain sharp and up to 8/10, constant. Worse with a lot of wrist and elbow motions. Bothers more by end of work day. Also with swelling, off and on pain top of right foot. No known injury. Pain 3/10 and up to 10/10 - also worse at end of work day. No skin changes, warmth, redness, numbness.  Past Medical History:  Diagnosis Date  . Asthma   . Seasonal allergies     Current Outpatient Prescriptions on File Prior to Visit  Medication Sig Dispense Refill  . albuterol (PROVENTIL HFA;VENTOLIN HFA) 108 (90 Base) MCG/ACT inhaler Inhale 1-2 puffs into the lungs every 6 (six) hours as needed for wheezing or shortness of breath. 1 Inhaler 0   No current facility-administered medications on file prior to visit.     Past Surgical History:  Procedure Laterality Date  . ABDOMINAL HYSTERECTOMY    . TONSILLECTOMY      Allergies  Allergen Reactions  . Cyclinex [Tetracycline]     rash  . Meloxicam   . Sulfa Antibiotics     rash    Social History   Social History  . Marital status: Single    Spouse name: N/A  . Number of children: N/A  . Years of education: N/A   Occupational History  . Not on file.   Social History Main Topics  . Smoking status: Current Every Day Smoker    Packs/day: 1.00    Types: Cigarettes  . Smokeless tobacco: Never Used  . Alcohol use No  . Drug use: No  . Sexual activity: Not on file   Other Topics Concern  . Not on file   Social History Narrative  . No narrative on file    No family history on file.  BP 128/76   Pulse 66   Ht 5\' 5"  (1.651 m)   Wt 180 lb (81.6 kg)   BMI 29.95 kg/m   Review of Systems: See HPI above.     Objective:  Physical Exam:  Gen: NAD, comfortable in exam room  Left elbow: No gross  deformity, swelling, bruising. FROM with pain on wrist extension. Minimal pain 3rd digit extension - no pain supination.  No pain elbow motions. TTP lateral epicondyle.  No other tenderness. Strength 5/5 all motions. Negative tinels cubital tunnel and radial tunnel. Collateral ligaments intact. NVI distally.  Right elbow: FROM without pain. No gross deformity.  Right foot/ankle: Localized swelling dorsal proximal foot, mobile mass.  No other deformity.  No bruising, rash. FROM ankle and digits without pain. TTP mildly over mass dorsal proximal foot. Negative ant drawer and talar tilt.   Negative syndesmotic compression. Negative metatarsal squeeze. Thompsons test negative. NV intact distally.  Left foot/ankle: FROM without pain, deformity.  MSK u/s right foot: Noted synovial cyst arising from midfoot measuring 2.3x0.5x4.0cm.  No solid component or neovascularity.    Assessment & Plan:  1. Left lateral epicondylitis - shown home exercises and stretches to do daily.  Counterforce brace.  Consider wrist brace also.  Start nitro - discussed risks of headache, skin irritation.  Consider PT, injection if not improving.  F/u in 6 weeks.  2. Right foot swelling - consistent with synovial cyst.  Reassured.  Naproxen, compression with arch binder.  Consider aspiration and injection in the future.  Icing.  F/u prn for this.

## 2017-01-14 DIAGNOSIS — M7712 Lateral epicondylitis, left elbow: Secondary | ICD-10-CM | POA: Insufficient documentation

## 2017-01-14 DIAGNOSIS — M7989 Other specified soft tissue disorders: Secondary | ICD-10-CM | POA: Insufficient documentation

## 2017-01-14 NOTE — Assessment & Plan Note (Signed)
consistent with synovial cyst.  Reassured.  Naproxen, compression with arch binder.  Consider aspiration and injection in the future.  Icing.  F/u prn for this.

## 2017-01-14 NOTE — Assessment & Plan Note (Signed)
shown home exercises and stretches to do daily.  Counterforce brace.  Consider wrist brace also.  Start nitro - discussed risks of headache, skin irritation.  Consider PT, injection if not improving.  F/u in 6 weeks.

## 2017-01-28 ENCOUNTER — Encounter: Payer: Self-pay | Admitting: Family Medicine

## 2017-01-28 ENCOUNTER — Ambulatory Visit (INDEPENDENT_AMBULATORY_CARE_PROVIDER_SITE_OTHER): Payer: BLUE CROSS/BLUE SHIELD | Admitting: Family Medicine

## 2017-01-28 DIAGNOSIS — M7989 Other specified soft tissue disorders: Secondary | ICD-10-CM | POA: Diagnosis not present

## 2017-01-28 DIAGNOSIS — M7712 Lateral epicondylitis, left elbow: Secondary | ICD-10-CM

## 2017-01-28 MED ORDER — METHYLPREDNISOLONE ACETATE 40 MG/ML IJ SUSP
40.0000 mg | Freq: Once | INTRAMUSCULAR | Status: AC
  Administered 2017-01-28: 40 mg via INTRA_ARTICULAR

## 2017-01-28 NOTE — Patient Instructions (Addendum)
You have lateral epicondylitis (tennis elbow) Try to avoid painful activities as much as possible. Ice the area 3-4 times a day for 15 minutes at a time. Counterforce brace as directed can help unload area - wear this regularly if it provides you with relief. Consider wrist brace as well to rest from wrist motions that will aggravate this. Hammer rotation exercise, wrist extension exercise with 1 pound weight - 3 sets of 10 once a day.   Stretching - hold for 20-30 seconds and repeat 3 times. Continue nitro patches 1/4th patch over affected elbow, change daily. Consider physical therapy, injection if not improving. Follow up in 6 weeks.  You have a synovial cyst on top of your right foot measuring 2.3x0.5x4.0cm. Continue compression with arch binder. We aspirated and injected this today. Naproxen 500mg  twice a day with food for pain and inflammation. Tylenol 500mg  1-2 tabs three times a day as needed for pain. Topical capsaicin or biofreeze to use up to 4 times a day for this or elbow. Icing 15 minutes at a time 3-4 times a day.

## 2017-01-29 ENCOUNTER — Encounter: Payer: Self-pay | Admitting: Family Medicine

## 2017-01-29 NOTE — Progress Notes (Signed)
PCP: System, Pcp Not In  Subjective:   HPI: Patient is a 46 y.o. female here for left elbow, right foot pain.  10/23: Patient reports she's had over 2 months of lateral left elbow pain though it's been much worse in past 2 weeks. Pain sharp and up to 8/10, constant. Worse with a lot of wrist and elbow motions. Bothers more by end of work day. Also with swelling, off and on pain top of right foot. No known injury. Pain 3/10 and up to 10/10 - also worse at end of work day. No skin changes, warmth, redness, numbness.  11/9: Patient returns reporting up to 10/10 level of pain in left lateral elbow and in her right dorsal foot. Continues to have swelling dorsal right foot also, pain radiates up shin. She would like to try aspiration/injection of her cyst here. She is wearing counterforce brace on elbow and nitro patches. Pain worse at night and by end of day at work in both. No skin changes, numbness.  Past Medical History:  Diagnosis Date  . Asthma   . Seasonal allergies     Current Outpatient Medications on File Prior to Visit  Medication Sig Dispense Refill  . albuterol (PROVENTIL HFA;VENTOLIN HFA) 108 (90 Base) MCG/ACT inhaler Inhale 1-2 puffs into the lungs every 6 (six) hours as needed for wheezing or shortness of breath. 1 Inhaler 0  . naproxen (NAPROSYN) 500 MG tablet Take 1 tablet (500 mg total) by mouth 2 (two) times daily as needed. 60 tablet 2  . nitroGLYCERIN (NITRODUR - DOSED IN MG/24 HR) 0.2 mg/hr patch Apply 1/4th patch to affected elbow, change daily 30 patch 1   No current facility-administered medications on file prior to visit.     Past Surgical History:  Procedure Laterality Date  . ABDOMINAL HYSTERECTOMY    . TONSILLECTOMY      Allergies  Allergen Reactions  . Cyclinex [Tetracycline]     rash  . Meloxicam   . Sulfa Antibiotics     rash    Social History   Socioeconomic History  . Marital status: Single    Spouse name: Not on file  . Number  of children: Not on file  . Years of education: Not on file  . Highest education level: Not on file  Social Needs  . Financial resource strain: Not on file  . Food insecurity - worry: Not on file  . Food insecurity - inability: Not on file  . Transportation needs - medical: Not on file  . Transportation needs - non-medical: Not on file  Occupational History  . Not on file  Tobacco Use  . Smoking status: Current Every Day Smoker    Packs/day: 1.00    Types: Cigarettes  . Smokeless tobacco: Never Used  Substance and Sexual Activity  . Alcohol use: No  . Drug use: No  . Sexual activity: Not on file  Other Topics Concern  . Not on file  Social History Narrative  . Not on file    History reviewed. No pertinent family history.  BP (!) 113/57   Pulse 74   Ht 5\' 5"  (1.651 m)   Wt 180 lb (81.6 kg)   BMI 29.95 kg/m   Review of Systems: See HPI above.     Objective:  Physical Exam:  Gen: NAD, comfortable in exam room.  Left elbow: No gross deformity, swelling, bruising. FROM with pain on wrist extension, 3rd digit extension but none on supination.  No pain elbow motions.  TTP lateral epicondyle.  No other tenderness. Negative tinels radial tunnel. Collateral ligaments intact. NVI distally.  Right elbow: FROM without pain.  Right foot/ankle: Localized swelling dorsal proximal foot, mobile mass. No other deformity.  No bruising. FROM ankle and digits without pain.  5/5 strength. TTP over mass noted. Negative ant drawer, talar tilt. Negative metatarsal squeeze. NV intact distally.    Assessment & Plan:  1. Left lateral epicondylitis - encouraged to continue with counterforce brace, wrist brace when possible.  Continue nitro, home exercises.  Consider PT, injection.  Keep scheduled follow-up in about a month.  2. Right foot pain/swelling - 2/2 synovial cyst.  Aspirated and injected today.  Naproxen, tylenol, arch binder.    After informed written consent timeout  was performed then patient was lying supine on exam table.  Ultrasound used to identify cyst away from dorsalis pedis and superficial veins.  Prepped with alcohol swab then 2mL bupivicaine used for local anesthesia.  Then using 18g needle on 10cc syringe, 2mL gelatinous fluid aspirated from cyst right dorsal foot followed by injection of 1mL depomedrol.  Patient tolerated procedure well without immediate complications.

## 2017-01-29 NOTE — Assessment & Plan Note (Signed)
2/2 synovial cyst.  Aspirated and injected today.  Naproxen, tylenol, arch binder.    After informed written consent timeout was performed then patient was lying supine on exam table.  Ultrasound used to identify cyst away from dorsalis pedis and superficial veins.  Prepped with alcohol swab then 2mL bupivicaine used for local anesthesia.  Then using 18g needle on 10cc syringe, 2mL gelatinous fluid aspirated from cyst right dorsal foot followed by injection of 1mL depomedrol.  Patient tolerated procedure well without immediate complications.

## 2017-01-29 NOTE — Assessment & Plan Note (Signed)
encouraged to continue with counterforce brace, wrist brace when possible.  Continue nitro, home exercises.  Consider PT, injection.  Keep scheduled follow-up in about a month.

## 2017-02-22 ENCOUNTER — Ambulatory Visit (INDEPENDENT_AMBULATORY_CARE_PROVIDER_SITE_OTHER): Payer: BLUE CROSS/BLUE SHIELD | Admitting: Family Medicine

## 2017-02-22 ENCOUNTER — Encounter: Payer: Self-pay | Admitting: Family Medicine

## 2017-02-22 DIAGNOSIS — M7712 Lateral epicondylitis, left elbow: Secondary | ICD-10-CM

## 2017-02-22 NOTE — Patient Instructions (Signed)
You have lateral epicondylitis (tennis elbow) Try to avoid painful activities as much as possible. Ice the area 3-4 times a day for 15 minutes at a time. Counterforce brace as directed can help unload area - wear this regularly if it provides you with relief. Wrist brace also if this helps you. Hammer rotation exercise, wrist extension exercises - 3 sets of 10 once a day - try larger hammer or a weight.   Stretching - hold for 20-30 seconds and repeat 3 times. Continue nitro patches 1/4th patch over affected elbow, change daily. Consider physical therapy, injection - let me know if you want to do either of these. Follow up in 6 weeks.  Wear the arch binder at least 3 more weeks then as needed.

## 2017-02-23 ENCOUNTER — Encounter: Payer: Self-pay | Admitting: Family Medicine

## 2017-02-23 NOTE — Progress Notes (Signed)
PCP: System, Pcp Not In  Subjective:   HPI: Patient is a 46 y.o. female here for left elbow, right foot pain.  10/23: Patient reports she's had over 2 months of lateral left elbow pain though it's been much worse in past 2 weeks. Pain sharp and up to 8/10, constant. Worse with a lot of wrist and elbow motions. Bothers more by end of work day. Also with swelling, off and on pain top of right foot. No known injury. Pain 3/10 and up to 10/10 - also worse at end of work day. No skin changes, warmth, redness, numbness.  11/9: Patient returns reporting up to 10/10 level of pain in left lateral elbow and in her right dorsal foot. Continues to have swelling dorsal right foot also, pain radiates up shin. She would like to try aspiration/injection of her cyst here. She is wearing counterforce brace on elbow and nitro patches. Pain worse at night and by end of day at work in both. No skin changes, numbness.  12/4: Patient reports her elbow pain has improved some down to 3/10 now though gets some ripping pain laterally at times. She is using wrist brace at night, counterforce brace other times, doing home exercises. Taking nitro patches - tried 1/2 patch but caused headache so using 1/4th patch. No new injuries. No skin changes, numbness.  Past Medical History:  Diagnosis Date  . Asthma   . Seasonal allergies     Current Outpatient Medications on File Prior to Visit  Medication Sig Dispense Refill  . albuterol (PROVENTIL HFA;VENTOLIN HFA) 108 (90 Base) MCG/ACT inhaler Inhale 1-2 puffs into the lungs every 6 (six) hours as needed for wheezing or shortness of breath. 1 Inhaler 0  . naproxen (NAPROSYN) 500 MG tablet Take 1 tablet (500 mg total) by mouth 2 (two) times daily as needed. 60 tablet 2  . nitroGLYCERIN (NITRODUR - DOSED IN MG/24 HR) 0.2 mg/hr patch Apply 1/4th patch to affected elbow, change daily 30 patch 1   No current facility-administered medications on file prior to visit.      Past Surgical History:  Procedure Laterality Date  . ABDOMINAL HYSTERECTOMY    . TONSILLECTOMY      Allergies  Allergen Reactions  . Cyclinex [Tetracycline]     rash  . Meloxicam   . Sulfa Antibiotics     rash    Social History   Socioeconomic History  . Marital status: Single    Spouse name: Not on file  . Number of children: Not on file  . Years of education: Not on file  . Highest education level: Not on file  Social Needs  . Financial resource strain: Not on file  . Food insecurity - worry: Not on file  . Food insecurity - inability: Not on file  . Transportation needs - medical: Not on file  . Transportation needs - non-medical: Not on file  Occupational History  . Not on file  Tobacco Use  . Smoking status: Current Every Day Smoker    Packs/day: 1.00    Types: Cigarettes  . Smokeless tobacco: Never Used  Substance and Sexual Activity  . Alcohol use: No  . Drug use: No  . Sexual activity: Not on file  Other Topics Concern  . Not on file  Social History Narrative  . Not on file    History reviewed. No pertinent family history.  BP 118/78   Pulse 71   Ht 5\' 5"  (1.651 m)   Wt 185 lb (  83.9 kg)   BMI 30.79 kg/m   Review of Systems: See HPI above.     Objective:  Physical Exam:  Gen: NAD, comfortable in exam room.  Left elbow: No gross deformity, swelling, bruising. FROM with pain on wrist extension, 3rd digit extension, supination.  5/5 strength. TTP lateral epicondyle.  No other tenderness. Collateral ligaments intact NVI distally.  Assessment & Plan:  1. Left lateral epicondylitis - Discussed options - she would like to continue with nitro, home exercises though increase resistance with these, wrist brace, counterforce brace.  Declined PT, injection for now.  F/u in 6 weeks.

## 2017-02-23 NOTE — Assessment & Plan Note (Signed)
Discussed options - she would like to continue with nitro, home exercises though increase resistance with these, wrist brace, counterforce brace.  Declined PT, injection for now.  F/u in 6 weeks.

## 2017-04-05 ENCOUNTER — Ambulatory Visit: Payer: BLUE CROSS/BLUE SHIELD | Admitting: Family Medicine

## 2017-04-12 ENCOUNTER — Ambulatory Visit: Payer: BLUE CROSS/BLUE SHIELD | Admitting: Family Medicine

## 2017-04-19 ENCOUNTER — Encounter: Payer: Self-pay | Admitting: Family Medicine

## 2017-04-19 ENCOUNTER — Ambulatory Visit (INDEPENDENT_AMBULATORY_CARE_PROVIDER_SITE_OTHER): Payer: BLUE CROSS/BLUE SHIELD | Admitting: Family Medicine

## 2017-04-19 DIAGNOSIS — M7712 Lateral epicondylitis, left elbow: Secondary | ICD-10-CM | POA: Diagnosis not present

## 2017-04-19 DIAGNOSIS — M7989 Other specified soft tissue disorders: Secondary | ICD-10-CM

## 2017-04-19 NOTE — Patient Instructions (Addendum)
Continue with the wrist brace another 4-6 weeks. Icing 15 minutes at a time 3-4 times a day as needed. You can stop the nitro patches - ok to restart if pain seems to be coming back though. Make appointment up front for 30 minutes to drain and inject the cyst on your foot/ankle.

## 2017-04-20 ENCOUNTER — Encounter: Payer: Self-pay | Admitting: Family Medicine

## 2017-04-20 NOTE — Progress Notes (Signed)
PCP: System, Pcp Not In  Subjective:   HPI: Patient is a 47 y.o. female here for left elbow, right foot pain.  10/23: Patient reports she's had over 2 months of lateral left elbow pain though it's been much worse in past 2 weeks. Pain sharp and up to 8/10, constant. Worse with a lot of wrist and elbow motions. Bothers more by end of work day. Also with swelling, off and on pain top of right foot. No known injury. Pain 3/10 and up to 10/10 - also worse at end of work day. No skin changes, warmth, redness, numbness.  11/9: Patient returns reporting up to 10/10 level of pain in left lateral elbow and in her right dorsal foot. Continues to have swelling dorsal right foot also, pain radiates up shin. She would like to try aspiration/injection of her cyst here. She is wearing counterforce brace on elbow and nitro patches. Pain worse at night and by end of day at work in both. No skin changes, numbness.  12/4: Patient reports her elbow pain has improved some down to 3/10 now though gets some ripping pain laterally at times. She is using wrist brace at night, counterforce brace other times, doing home exercises. Taking nitro patches - tried 1/2 patch but caused headache so using 1/4th patch. No new injuries. No skin changes, numbness.  04/19/17: Patient reports she's doing well. Pain is down to 1/10 level. Had some swelling a couple weeks ago. Using only wrist brace, doing some of the home exercises. Not taking any medications now, done with nitro. Getting better at picking up items. No skin changes, numbness. She also noted the cyst on her right foot appears to be getting larger again with mild soreness.  Past Medical History:  Diagnosis Date  . Asthma   . Seasonal allergies     Current Outpatient Medications on File Prior to Visit  Medication Sig Dispense Refill  . albuterol (PROVENTIL HFA;VENTOLIN HFA) 108 (90 Base) MCG/ACT inhaler Inhale 1-2 puffs into the lungs every 6  (six) hours as needed for wheezing or shortness of breath. 1 Inhaler 0  . naproxen (NAPROSYN) 500 MG tablet Take 1 tablet (500 mg total) by mouth 2 (two) times daily as needed. 60 tablet 2  . nitroGLYCERIN (NITRODUR - DOSED IN MG/24 HR) 0.2 mg/hr patch Apply 1/4th patch to affected elbow, change daily 30 patch 1   No current facility-administered medications on file prior to visit.     Past Surgical History:  Procedure Laterality Date  . ABDOMINAL HYSTERECTOMY    . TONSILLECTOMY      Allergies  Allergen Reactions  . Cyclinex [Tetracycline]     rash  . Meloxicam   . Sulfa Antibiotics     rash    Social History   Socioeconomic History  . Marital status: Single    Spouse name: Not on file  . Number of children: Not on file  . Years of education: Not on file  . Highest education level: Not on file  Social Needs  . Financial resource strain: Not on file  . Food insecurity - worry: Not on file  . Food insecurity - inability: Not on file  . Transportation needs - medical: Not on file  . Transportation needs - non-medical: Not on file  Occupational History  . Not on file  Tobacco Use  . Smoking status: Current Every Day Smoker    Packs/day: 1.00    Types: Cigarettes  . Smokeless tobacco: Never Used  Substance  and Sexual Activity  . Alcohol use: No  . Drug use: No  . Sexual activity: Not on file  Other Topics Concern  . Not on file  Social History Narrative  . Not on file    History reviewed. No pertinent family history.  BP 114/75   Pulse 67   Ht 5\' 5"  (1.651 m)   Wt 190 lb (86.2 kg)   BMI 31.62 kg/m   Review of Systems: See HPI above.     Objective:  Physical Exam:  Gen: NAD, comfortable in exam room.  Left elbow: No deformity, swelling, bruising. FROM without pain on wrist extension, 3rd digit extension, supination.  5/5 strength. Minimal TTP lateral condyle.  No other tenderness. Collateral ligaments intact. NVI distally.  Right foot: Mild  focal swelling dorsal foot, small tender mobile mass here without overlying erythema, drainage, warmth.  Assessment & Plan:  1. Left lateral epicondylitis - Much improved.  Encouraged continuing wrist brace for 4-6 more weeks.  Icing regularly.  Tylenol only if needed.  She stopped nitro patches a couple days ago because of an unrelated headache - ok to completely discontinue.  F/u prn for this.  2. Dorsal ganglion cyst of foot - she would like to have this aspirated and injected again - she will make appointment to come back and do so.  No red flags.

## 2017-04-20 NOTE — Assessment & Plan Note (Signed)
Much improved.  Encouraged continuing wrist brace for 4-6 more weeks.  Icing regularly.  Tylenol only if needed.  She stopped nitro patches a couple days ago because of an unrelated headache - ok to completely discontinue.  F/u prn for this.

## 2017-04-20 NOTE — Assessment & Plan Note (Signed)
Dorsal ganglion cyst of foot - she would like to have this aspirated and injected again - she will make appointment to come back and do so.  No red flags.

## 2017-04-25 ENCOUNTER — Ambulatory Visit (INDEPENDENT_AMBULATORY_CARE_PROVIDER_SITE_OTHER): Payer: BLUE CROSS/BLUE SHIELD | Admitting: Family Medicine

## 2017-04-25 ENCOUNTER — Encounter: Payer: Self-pay | Admitting: Family Medicine

## 2017-04-25 DIAGNOSIS — M7989 Other specified soft tissue disorders: Secondary | ICD-10-CM

## 2017-04-25 MED ORDER — METHYLPREDNISOLONE ACETATE 40 MG/ML IJ SUSP
40.0000 mg | Freq: Once | INTRAMUSCULAR | Status: AC
  Administered 2017-04-25: 40 mg via INTRA_ARTICULAR

## 2017-04-25 NOTE — Progress Notes (Signed)
PCP: System, Pcp Not In  Subjective:   HPI: Patient is a 47 y.o. female here for right foot pain.  10/23: Patient reports she's had over 2 months of lateral left elbow pain though it's been much worse in past 2 weeks. Pain sharp and up to 8/10, constant. Worse with a lot of wrist and elbow motions. Bothers more by end of work day. Also with swelling, off and on pain top of right foot. No known injury. Pain 3/10 and up to 10/10 - also worse at end of work day. No skin changes, warmth, redness, numbness.  11/9: Patient returns reporting up to 10/10 level of pain in left lateral elbow and in her right dorsal foot. Continues to have swelling dorsal right foot also, pain radiates up shin. She would like to try aspiration/injection of her cyst here. She is wearing counterforce brace on elbow and nitro patches. Pain worse at night and by end of day at work in both. No skin changes, numbness.  12/4: Patient reports her elbow pain has improved some down to 3/10 now though gets some ripping pain laterally at times. She is using wrist brace at night, counterforce brace other times, doing home exercises. Taking nitro patches - tried 1/2 patch but caused headache so using 1/4th patch. No new injuries. No skin changes, numbness.  04/19/17: Patient reports she's doing well. Pain is down to 1/10 level. Had some swelling a couple weeks ago. Using only wrist brace, doing some of the home exercises. Not taking any medications now, done with nitro. Getting better at picking up items. No skin changes, numbness. She also noted the cyst on her right foot appears to be getting larger again with mild soreness.  2/4:  Past Medical History:  Diagnosis Date  . Asthma   . Seasonal allergies     Current Outpatient Medications on File Prior to Visit  Medication Sig Dispense Refill  . albuterol (PROVENTIL HFA;VENTOLIN HFA) 108 (90 Base) MCG/ACT inhaler Inhale 1-2 puffs into the lungs every 6 (six)  hours as needed for wheezing or shortness of breath. 1 Inhaler 0  . naproxen (NAPROSYN) 500 MG tablet Take 1 tablet (500 mg total) by mouth 2 (two) times daily as needed. 60 tablet 2  . nitroGLYCERIN (NITRODUR - DOSED IN MG/24 HR) 0.2 mg/hr patch Apply 1/4th patch to affected elbow, change daily 30 patch 1   No current facility-administered medications on file prior to visit.     Past Surgical History:  Procedure Laterality Date  . ABDOMINAL HYSTERECTOMY    . TONSILLECTOMY      Allergies  Allergen Reactions  . Cyclinex [Tetracycline]     rash  . Meloxicam   . Sulfa Antibiotics     rash    Social History   Socioeconomic History  . Marital status: Single    Spouse name: Not on file  . Number of children: Not on file  . Years of education: Not on file  . Highest education level: Not on file  Social Needs  . Financial resource strain: Not on file  . Food insecurity - worry: Not on file  . Food insecurity - inability: Not on file  . Transportation needs - medical: Not on file  . Transportation needs - non-medical: Not on file  Occupational History  . Not on file  Tobacco Use  . Smoking status: Current Every Day Smoker    Packs/day: 1.00    Types: Cigarettes  . Smokeless tobacco: Never Used  Substance  and Sexual Activity  . Alcohol use: No  . Drug use: No  . Sexual activity: Not on file  Other Topics Concern  . Not on file  Social History Narrative  . Not on file    History reviewed. No pertinent family history.  BP 110/78   Pulse 76   Ht 5\' 5"  (1.651 m)   Wt 190 lb (86.2 kg)   BMI 31.62 kg/m   Review of Systems: See HPI above.     Objective:  Physical Exam:  Exam not repeated today. Gen: NAD, comfortable in exam room.  Left elbow: No deformity, swelling, bruising. FROM without pain on wrist extension, 3rd digit extension, supination.  5/5 strength. Minimal TTP lateral condyle.  No other tenderness. Collateral ligaments intact. NVI  distally.  Right foot: Mild focal swelling dorsal foot, small tender mobile mass here without overlying erythema, drainage, warmth.  Assessment & Plan:  1. Ganglion cyst of foot.  We repeated aspiration/injection today.  Continue with arch binder.  Tylenol, naproxen if needed.  After informed written consent timeout was performed and patient was lying supine on exam table.  Ultrasound used to identify cyst away from vascular structures including dorsalis pedis.  Prepped with alcohol swab then 2mL bupivicaine used for local anesthesia.  Then using 18g needle on 10cc syringe, 3.39mL gelatinous fluid aspirated from cyst right dorsolateral foot followed by injection of 1mL depomedrol.  Patient tolerated procedure well without immediate complications.

## 2017-04-25 NOTE — Assessment & Plan Note (Signed)
ganglion cyst of foot.  We repeated aspiration/injection today.  Continue with arch binder.  Tylenol, naproxen if needed.  After informed written consent timeout was performed and patient was lying supine on exam table.  Ultrasound used to identify cyst away from vascular structures including dorsalis pedis.  Prepped with alcohol swab then 2mL bupivicaine used for local anesthesia.  Then using 18g needle on 10cc syringe, 3.665mL gelatinous fluid aspirated from cyst right dorsolateral foot followed by injection of 1mL depomedrol.  Patient tolerated procedure well without immediate complications.

## 2018-08-16 IMAGING — DX DG TOE 4TH 2+V*L*
3 series · 3 of 3 positions shown · non-contrast
Comparison: None.

CLINICAL DATA: Previous fracture fourth distal phalanx

EXAM:
LEFT FOURTH TOE:  3 V

[toe ap]
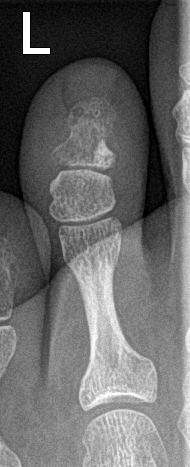

[toe obl]
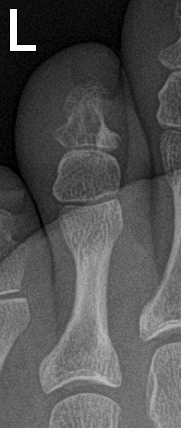

[toe lat]
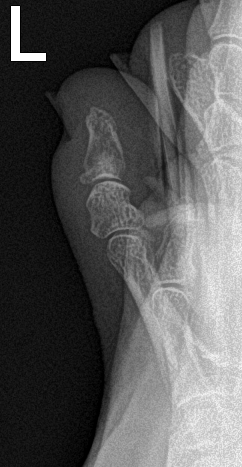

[3 of 3 positions shown; findings below may reference images not displayed]

FINDINGS: Frontal, oblique, and lateral views obtained. The previous fracture
along the dorsal proximal aspect of the fourth distal phalanx is
less well seen at this time, indicative of healing response. No new
fracture. No dislocation. Joint spaces appear normal. No erosive
change.
IMPRESSION: Healing response in area of fracture of proximal portion fourth
distal phalanx. No new fracture. No dislocation. No apparent
arthropathy.

## 2018-09-18 IMAGING — CR DG FOOT COMPLETE 3+V*R*
3 series · 3 of 3 positions shown · non-contrast
Comparison: None.

CLINICAL DATA: Subacute right foot swelling for several months. No
known injury. Initial encounter.

EXAM:
RIGHT FOOT COMPLETE - 3+ VIEW

[t foot ap right]
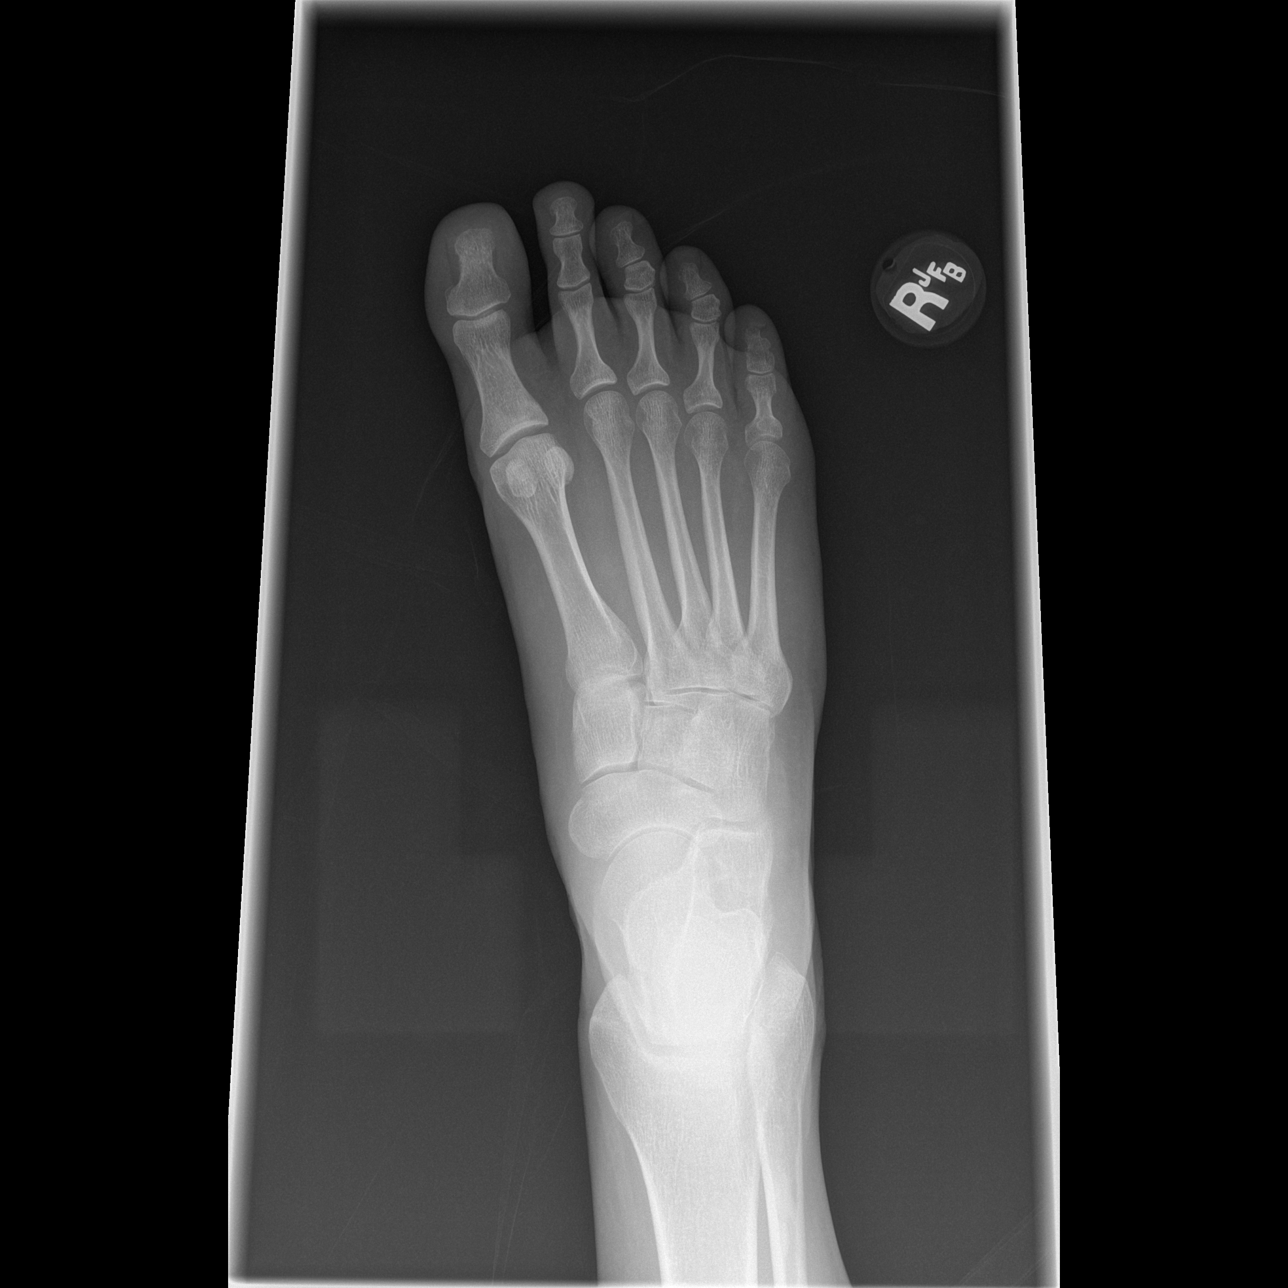

[t foot oblique right]
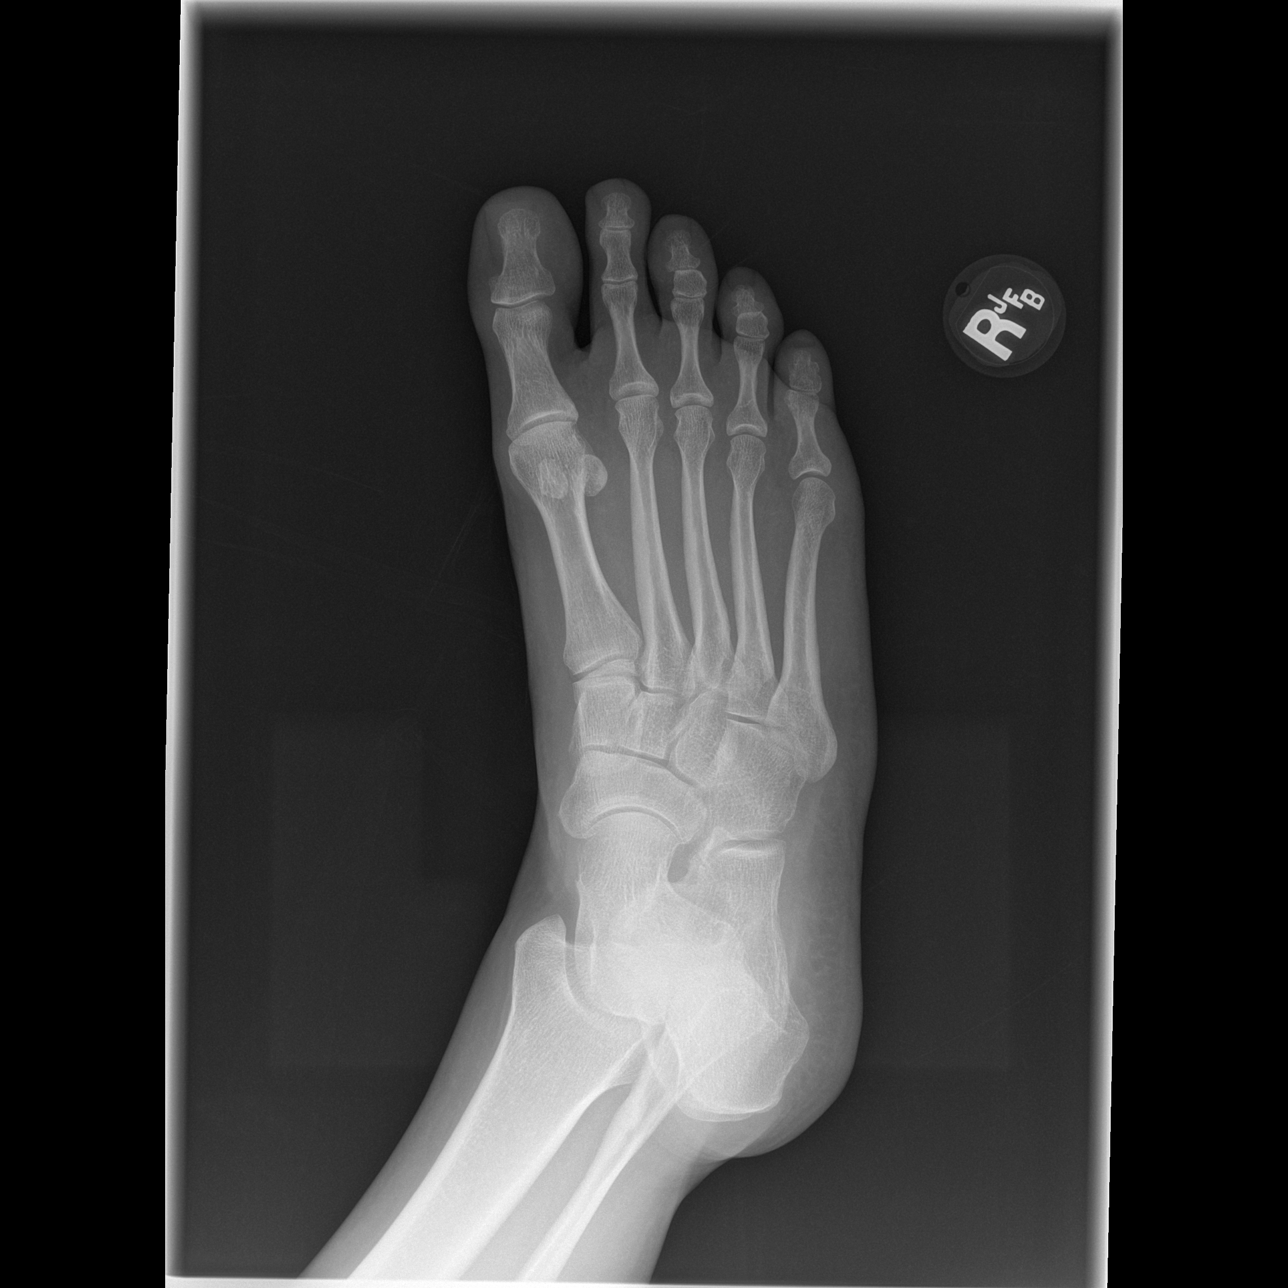

[t foot lat right]
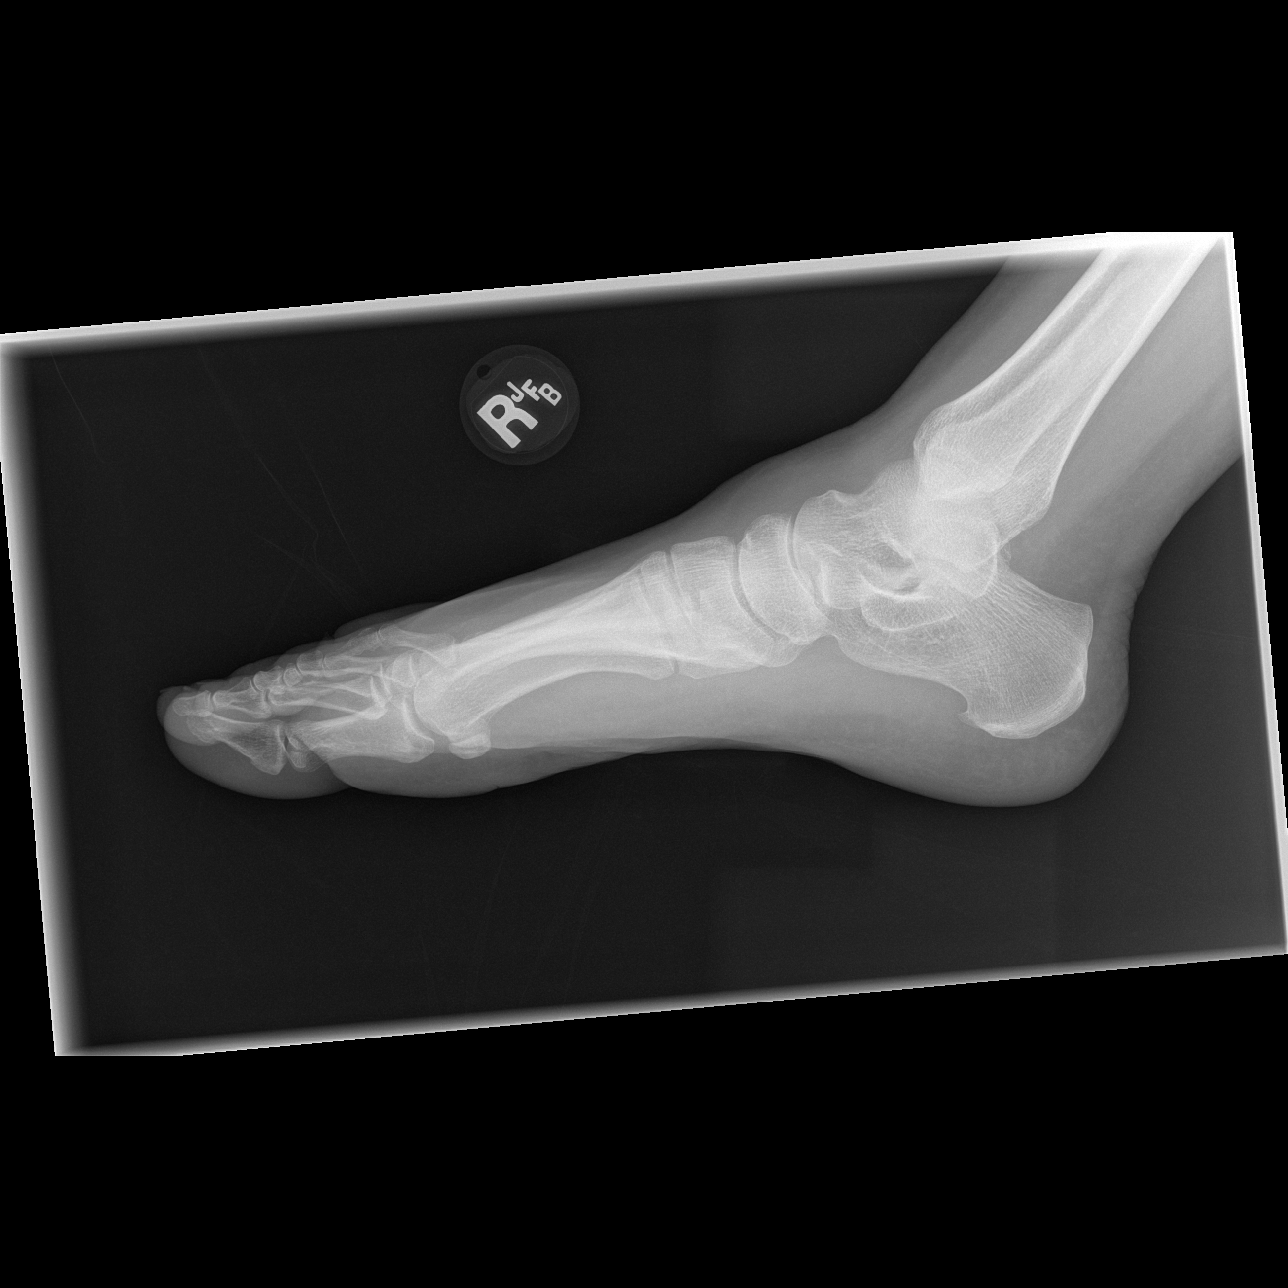

[3 of 3 positions shown; findings below may reference images not displayed]

FINDINGS: There is no evidence of acute fracture, subluxation or dislocation.

No focal bony lesion for erosive changes noted.

The Lisfranc joints are unremarkable.

There may be mild dorsal soft tissue swelling present.
IMPRESSION: Question mild dorsal soft tissue swelling. No bony or joint
abnormalities identified.

## 2018-09-21 ENCOUNTER — Other Ambulatory Visit: Payer: Self-pay | Admitting: Physician Assistant

## 2018-09-21 DIAGNOSIS — Z1231 Encounter for screening mammogram for malignant neoplasm of breast: Secondary | ICD-10-CM

## 2018-11-01 ENCOUNTER — Ambulatory Visit: Payer: BLUE CROSS/BLUE SHIELD

## 2018-12-14 ENCOUNTER — Inpatient Hospital Stay: Admission: RE | Admit: 2018-12-14 | Payer: Self-pay | Source: Ambulatory Visit

## 2019-02-01 ENCOUNTER — Encounter (HOSPITAL_BASED_OUTPATIENT_CLINIC_OR_DEPARTMENT_OTHER): Payer: Self-pay | Admitting: *Deleted

## 2019-02-01 ENCOUNTER — Emergency Department (HOSPITAL_BASED_OUTPATIENT_CLINIC_OR_DEPARTMENT_OTHER)
Admission: EM | Admit: 2019-02-01 | Discharge: 2019-02-01 | Disposition: A | Discharge status: Home / Self Care | Payer: BC Managed Care – PPO | Attending: Emergency Medicine | Admitting: Emergency Medicine

## 2019-02-01 ENCOUNTER — Other Ambulatory Visit: Payer: Self-pay

## 2019-02-01 DIAGNOSIS — H1045 Other chronic allergic conjunctivitis: Secondary | ICD-10-CM | POA: Insufficient documentation

## 2019-02-01 DIAGNOSIS — H1013 Acute atopic conjunctivitis, bilateral: Secondary | ICD-10-CM

## 2019-02-01 DIAGNOSIS — J45909 Unspecified asthma, uncomplicated: Secondary | ICD-10-CM | POA: Diagnosis not present

## 2019-02-01 DIAGNOSIS — Z882 Allergy status to sulfonamides status: Secondary | ICD-10-CM | POA: Diagnosis not present

## 2019-02-01 DIAGNOSIS — Z888 Allergy status to other drugs, medicaments and biological substances status: Secondary | ICD-10-CM | POA: Diagnosis not present

## 2019-02-01 DIAGNOSIS — F17210 Nicotine dependence, cigarettes, uncomplicated: Secondary | ICD-10-CM | POA: Diagnosis not present

## 2019-02-01 DIAGNOSIS — Z886 Allergy status to analgesic agent status: Secondary | ICD-10-CM | POA: Insufficient documentation

## 2019-02-01 DIAGNOSIS — H5789 Other specified disorders of eye and adnexa: Secondary | ICD-10-CM | POA: Diagnosis present

## 2019-02-01 MED ORDER — PREDNISONE 50 MG PO TABS
60.0000 mg | ORAL_TABLET | Freq: Once | ORAL | Status: AC
  Administered 2019-02-01: 60 mg via ORAL
  Filled 2019-02-01: qty 1

## 2019-02-01 MED ORDER — TETRACAINE HCL 0.5 % OP SOLN
2.0000 [drp] | Freq: Once | OPHTHALMIC | Status: AC
  Administered 2019-02-01: 2 [drp] via OPHTHALMIC
  Filled 2019-02-01: qty 4

## 2019-02-01 MED ORDER — FLUORESCEIN SODIUM 1 MG OP STRP
1.0000 | ORAL_STRIP | Freq: Once | OPHTHALMIC | Status: AC
  Administered 2019-02-01: 1 via OPHTHALMIC
  Filled 2019-02-01: qty 1

## 2019-02-01 MED ORDER — PREDNISONE 10 MG (21) PO TBPK: ORAL_TABLET | Freq: Every day | ORAL | 0 refills | Status: AC

## 2019-02-01 NOTE — ED Notes (Signed)
ED Provider at bedside. 

## 2019-02-01 NOTE — ED Provider Notes (Signed)
[facility]PART[facility] Provider Note   CSN: [id number] Arrival date & time: 02/01/19  1713     History   Chief Complaint Chief Complaint  Patient presents with  . Eye Problem    HPI Heather Weiss is a 48 y.o. female.     Patient is a 48 year old female with past medical history of asthma and seasonal allergies presenting to the emergency department for bilateral eye swelling.  Patient reports that for the past 2 days she has been waking up with swollen eyelids.  The swelling gradually gets better throughout the day.  She also notes that her eyes were bilaterally injected with clear drainage and she had the urge to rub her eyes.  She feels like her eyes are gritty and there may be something in it.  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  Denies any new soaps, lotions, detergents.  Denies any fever, chills, sore throat, ear pain, cough.  Denies any purulent drainage, vision changes, glasses or contacts.  She tried some over-the-counter allergy eyedrops but reports they burned her eyes so she did not try them again.     Past Medical History:  Diagnosis Date  . Asthma   . Seasonal allergies     Patient Active Problem List   Diagnosis Date Noted  . Swelling of right foot 01/14/2017  . Left lateral epicondylitis 01/14/2017  . Closed fracture of phalanx of left fourth toe 10/28/2016    Past Surgical History:  Procedure Laterality Date  . ABDOMINAL HYSTERECTOMY    . TONSILLECTOMY       OB History   No obstetric history on file.      Home Medications    Prior to Admission medications   Medication Sig Start Date End Date Taking? Authorizing Provider  albuterol (PROVENTIL HFA;VENTOLIN HFA) 108 (90 Base) MCG/ACT inhaler Inhale 1-2 puffs into the lungs every 6 (six) hours as needed for wheezing or shortness of breath. 08/02/15  Yes Ward, Ozella Almond, PA-C  naproxen (NAPROSYN) 500 MG tablet Take 1 tablet (500 mg  total) by mouth 2 (two) times daily as needed. 01/11/17   Dene Gentry, MD  nitroGLYCERIN (NITRODUR - DOSED IN MG/24 HR) 0.2 mg/hr patch Apply 1/4th patch to affected elbow, change daily 01/11/17   Hudnall, Sharyn Lull, MD  predniSONE (STERAPRED UNI-PAK 21 TAB) 10 MG (21) TBPK tablet Take by mouth daily for 12 days. Take 6 tabs by mouth daily  for 2 days, then 5 tabs for 2 days, then 4 tabs for 2 days, then 3 tabs for 2 days, 2 tabs for 2 days, then 1 tab by mouth daily for 2 days 02/01/19 02/13/19  Alveria Apley, PA-C    Family History No family history on file.  Social History Social History   Tobacco Use  . Smoking status: Current Every Day Smoker    Packs/day: 1.00    Types: Cigarettes  . Smokeless tobacco: Never Used  Substance Use Topics  . Alcohol use: No  . Drug use: No     Allergies   Cyclinex [tetracycline], Meloxicam, and Sulfa antibiotics   Review of Systems Review of Systems  Constitutional: Negative for chills and fever.     Physical Exam Updated Vital Signs BP 109/81 (BP Location: Left Arm)   Pulse 74   Temp 98.8 F (37.1 C) (Oral)   Resp 14   Ht 5\' 4"  (1.626 m)   Wt 73 kg  SpO2 100%   BMI 27.64 kg/m   Physical Exam Vitals signs and nursing note reviewed.  Constitutional:      General: She is not in acute distress.    Appearance: Normal appearance. She is not ill-appearing, toxic-appearing or diaphoretic.  HENT:     Head: Normocephalic.  Eyes:     General: Lids are normal. Lids are everted, no foreign bodies appreciated. Vision grossly intact.        Right eye: No foreign body, discharge or hordeolum.        Left eye: No foreign body, discharge or hordeolum.     Conjunctiva/sclera:     Right eye: No chemosis, exudate or hemorrhage.    Left eye: No chemosis, exudate or hemorrhage.    Pupils: Pupils are equal, round, and reactive to light.     Slit lamp exam:    Left eye: No corneal ulcer.     Comments: Very mildly injected bilateral  conjunctiva.  Fluorescein eye stain of bilateral eyes revealed no defects or corneal abrasions.  Pulmonary:     Effort: Pulmonary effort is normal.  Skin:    General: Skin is warm and dry.  Neurological:     General: No focal deficit present.     Mental Status: She is alert.  Psychiatric:        Mood and Affect: Mood normal.      ED Treatments / Results  Labs (all labs ordered are listed, but only abnormal results are displayed) Labs Reviewed - No data to display  EKG None  Radiology No results found.  Procedures Procedures (including critical care time)  Medications Ordered in ED Medications  tetracaine (PONTOCAINE) 0.5 % ophthalmic solution 2 drop (2 drops Both Eyes Given 02/01/19 1737)  fluorescein ophthalmic strip 1 strip (1 strip Both Eyes Given 02/01/19 1737)  predniSONE (DELTASONE) tablet 60 mg (60 mg Oral Given 02/01/19 1749)     Initial Impression / Assessment and Plan / ED Course  I have reviewed the triage vital signs and the nursing notes.  Pertinent labs & imaging results that were available during my care of the patient were reviewed by me and considered in my medical decision making (see chart for details).  Clinical Course as of Jan 31 1749  Thu Feb 01, 2019  1747 Given patient's symptoms and presentation I think she has allergic conjunctivitis.  Symptoms are waxing and waning and she does have a history of moderate to severe environmental allergies.  I will start the patient on a course of steroid medication.  She was advised to follow-up with allergy specialist.  She was advised not to use any kind of products on her face as this may be the irritant.  Advised to return to emergency department if she has any new or worsening symptoms.   [KM]    Clinical Course User Index [KM] Arlyn Dunning, PA-C       Based on review of vitals, medical screening exam, lab work and/or imaging, there does not appear to be an acute, emergent etiology for the  patient's symptoms. Counseled pt on good return precautions and encouraged both PCP and ED follow-up as needed.  Prior to discharge, I also discussed incidental imaging findings with patient in detail and advised appropriate, recommended follow-up in detail.  Clinical Impression: 1. Allergic conjunctivitis of both eyes     Disposition: Discharge  Prior to providing a prescription for a controlled substance, I independently reviewed the patient's recent prescription history on the  [place] Controlled Substance Reporting System. The patient had no recent or regular prescriptions and was deemed appropriate for a brief, less than 3 day prescription of narcotic for acute analgesia.  This note was prepared with assistance of Conservation officer, historic buildingsDragon voice recognition software. Occasional wrong-word or sound-a-like substitutions may have occurred due to the inherent limitations of voice recognition software.   Final Clinical Impressions(s) / ED Diagnoses   Final diagnoses:  Allergic conjunctivitis of both eyes    ED Discharge Orders         Ordered    predniSONE (STERAPRED UNI-PAK 21 TAB) 10 MG (21) TBPK tablet  Daily     02/01/19 1749           Jeral PinchMcLean, Lonzell Dorris A, PA-C 02/01/19 1750    Virgina Norfolkuratolo, Adam, DO 02/01/19 1944

## 2019-02-01 NOTE — ED Triage Notes (Signed)
Yesterday she woke with pain in her left eye. No itching or redness.

## 2019-02-22 ENCOUNTER — Ambulatory Visit: Payer: BC Managed Care – PPO | Admitting: Allergy and Immunology

## 2019-02-22 ENCOUNTER — Encounter: Payer: Self-pay | Admitting: Allergy and Immunology

## 2019-02-22 ENCOUNTER — Other Ambulatory Visit: Payer: Self-pay

## 2019-02-22 VITALS — BP 126/70 | HR 74 | Temp 97.2°F | Resp 16 | Ht 63.0 in | Wt 161.8 lb

## 2019-02-22 DIAGNOSIS — H1010 Acute atopic conjunctivitis, unspecified eye: Secondary | ICD-10-CM | POA: Insufficient documentation

## 2019-02-22 DIAGNOSIS — J3089 Other allergic rhinitis: Secondary | ICD-10-CM | POA: Diagnosis not present

## 2019-02-22 DIAGNOSIS — J4520 Mild intermittent asthma, uncomplicated: Secondary | ICD-10-CM | POA: Diagnosis not present

## 2019-02-22 DIAGNOSIS — H1013 Acute atopic conjunctivitis, bilateral: Secondary | ICD-10-CM

## 2019-02-22 MED ORDER — AZELASTINE HCL 0.15 % NA SOLN: 1.0000 | Freq: Two times a day (BID) | NASAL | 5 refills | Status: DC | PRN

## 2019-02-22 MED ORDER — FLOVENT HFA 110 MCG/ACT IN AERO: INHALATION_SPRAY | RESPIRATORY_TRACT | 3 refills | Status: DC

## 2019-02-22 MED ORDER — LEVOCETIRIZINE DIHYDROCHLORIDE 5 MG PO TABS: 5.0000 mg | ORAL_TABLET | Freq: Every day | ORAL | 5 refills | Status: DC | PRN

## 2019-02-22 MED ORDER — PAZEO 0.7 % OP SOLN: 1.0000 [drp] | Freq: Every day | OPHTHALMIC | 5 refills | Status: DC | PRN

## 2019-02-22 NOTE — Progress Notes (Signed)
New Patient Note  RE: Heather Weiss MRN: [id number] DOB: 10/09/1970 Date of Office Visit: 02/22/2019  Referring provider: No ref. provider found Primary care provider: Maud Deed, [place]  Chief Complaint: Conjunctivitis, Angioedema, and Allergic Rhinitis   History of present illness: Heather Weiss is a 48 y.o. female presenting today for evaluation of conjunctivitis and eyelid swelling.  She reports that on November 10 while working in the evening she began to experience mild ocular pruritus.  When she woke up the next morning she noticed swelling of the eyelids and continued to experience ocular pruritus and lacrimation.  She also noted scleral injection.  While at home throughout the day her symptoms began to resolve.  When she went back to work that evening she began to experience ocular symptoms again and woke up the next morning with significant conjunctivitis and eyelid edema.  Out of concern, she went to the emergency department for evaluation.  They ruled out foreign particles and corneal scratches, prescribed a Medrol Dosepak, told her to stop wearing make-up for a week, and recommended that she pursue evaluation by an allergist.  She did not experience concomitant urticaria, cardiopulmonary symptoms, or GI symptoms.  She notes that she had experienced increased nasal congestion, rhinorrhea, and sneezing.  In addition, while at work a few days prior she felt that her asthma was mildly exacerbated but she did not have albuterol.  She reports that she was diagnosed with allergy induced asthma in 2009.  Assessment and plan: Conjunctivitis  The patient's history suggests severe allergic conjunctivitis, however epicutaneous food and environmental tests were negative despite a positive histamine control and intradermal environmental tests were positive only to [place] grass.  It is possible that she has being exposed to some substance/chemical at her place of work.  Aeroallergen  avoidance measures have been discussed and provided in written form.  A prescription has been provided for levocetirizine(Xyzal), 5 mg daily as needed.  A prescription has been provided for Pazeo, one drop per eye daily as needed.  I have also recommended eye lubricant drops (i.e., Natural Tears) as needed.  Seasonal allergic rhinitis with a nonallergic component  Aeroallergen avoidance measures have been discussed and provided in written form.  Levocetirizine has been prescribed (as above).  A prescription has been provided for azelastine nasal spray, 1-2 sprays per nostril 2 times daily as needed. Proper nasal spray technique has been discussed and demonstrated.   Nasal saline spray (i.e., Simply Saline) or nasal saline lavage (i.e., NeilMed) is recommended as needed and prior to medicated nasal sprays.  Mild intermittent asthma  Continue albuterol HFA, 1 to 2 inhalations every 4-6 hours if needed.  During asthma flares, add Flovent 110g 2 inhalations 2 times per day until symptoms have returned to baseline.  To maximize pulmonary deposition, a spacer has been provided along with instructions for its proper administration with an HFA inhaler.  Subjective and objective measures of pulmonary function will be followed and the treatment plan will be adjusted accordingly.   Meds ordered this encounter  Medications  . levocetirizine (XYZAL) 5 MG tablet    Sig: Take 1 tablet (5 mg total) by mouth daily as needed for allergies.    Dispense:  30 tablet    Refill:  5  . PAZEO 0.7 % SOLN    Sig: Place 1 drop into both eyes daily as needed.    Dispense:  2.5 mL    Refill:  5  . fluticasone (FLOVENT HFA) 110 MCG/ACT inhaler  Sig: Add 2 puffs 2 times daily during asthma flares until symptoms return to normal    Dispense:  1 Inhaler    Refill:  3  . Azelastine HCl 0.15 % SOLN    Sig: Place 1-2 sprays into both nostrils 2 (two) times daily as needed.    Dispense:  30 mL    Refill:   5    Diagnostics: Spirometry: Spirometry reveals an FVC of 2.53 L and an FEV1 of 2.49 L (90% predicted) with 190 mL postbronchodilator improvement.  This study was performed while the patient was asymptomatic.  Please see scanned spirometry results for details. Epicutaneous testing: Negative despite a positive histamine control. Intradermal testing: Positive to [place] grass.  Physical examination: Blood pressure 126/70, pulse 74, temperature (!) 97.2 F (36.2 C), temperature source Temporal, resp. rate 16, height 5\' 3"  (1.6 m), weight 161 lb 12.8 oz (73.4 kg), SpO2 99 %.  General: Alert, interactive, in no acute distress. HEENT: TMs pearly gray, turbinates moderately edematous with thick discharge, post-pharynx moderately erythematous. Mild periorbital edema bilaterally. Neck: Supple without lymphadenopathy. Lungs: Clear to auscultation without wheezing, rhonchi or rales. CV: Normal S1, S2 without murmurs. Abdomen: Nondistended, nontender. Skin: Warm and dry, without lesions or rashes. Extremities:  No clubbing, cyanosis or edema. Neuro:   Grossly intact.  Review of systems:  Review of systems negative except as noted in HPI / PMHx or noted below: Review of Systems  Constitutional: Negative.   HENT: Negative.   Eyes: Negative.   Respiratory: Negative.   Cardiovascular: Negative.   Gastrointestinal: Negative.   Genitourinary: Negative.   Musculoskeletal: Negative.   Skin: Negative.   Neurological: Negative.   Endo/Heme/Allergies: Negative.   Psychiatric/Behavioral: Negative.     Past medical history:  Past Medical History:  Diagnosis Date  . Angio-edema   . Asthma   . Seasonal allergies     Past surgical history:  Past Surgical History:  Procedure Laterality Date  . ABDOMINAL HYSTERECTOMY    . ADENOIDECTOMY    . SHOULDER SURGERY Right   . TONSILLECTOMY    . WISDOM TOOTH EXTRACTION      Family history: Family History  Problem Relation Age of Onset  . COPD  Mother   . Lung cancer Father   . Allergic rhinitis Neg Hx   . Angioedema Neg Hx   . Asthma Neg Hx   . Atopy Neg Hx   . Eczema Neg Hx   . Immunodeficiency Neg Hx   . Urticaria Neg Hx     Social history: Social History   Socioeconomic History  . Marital status: Single    Spouse name: Not on file  . Number of children: Not on file  . Years of education: Not on file  . Highest education level: Not on file  Occupational History  . Not on file  Social Needs  . Financial resource strain: Not on file  . Food insecurity    Worry: Not on file    Inability: Not on file  . Transportation needs    Medical: Not on file    Non-medical: Not on file  Tobacco Use  . Smoking status: Current Every Day Smoker    Packs/day: 1.00    Types: Cigarettes  . Smokeless tobacco: Never Used  Substance and Sexual Activity  . Alcohol use: Yes    Comment: rare  . Drug use: No  . Sexual activity: Not on file  Lifestyle  . Physical activity    Days per week:  Not on file    Minutes per session: Not on file  . Stress: Not on file  Relationships  . Social Musicianconnections    Talks on phone: Not on file    Gets together: Not on file    Attends religious service: Not on file    Active member of club or organization: Not on file    Attends meetings of clubs or organizations: Not on file    Relationship status: Not on file  . Intimate partner violence    Fear of current or ex partner: Not on file    Emotionally abused: Not on file    Physically abused: Not on file    Forced sexual activity: Not on file  Other Topics Concern  . Not on file  Social History Narrative  . Not on file    Environmental History: Patient lives in an apartment built in the 1970s with hardwood floors throughout and central air/heat.  There is mold/water damage in the apartment.  There are no pets in the home.  She has smoked since she was 48 years old.  Current Outpatient Medications  Medication Sig Dispense Refill  .  albuterol (PROVENTIL HFA;VENTOLIN HFA) 108 (90 Base) MCG/ACT inhaler Inhale 1-2 puffs into the lungs every 6 (six) hours as needed for wheezing or shortness of breath. 1 Inhaler 0  . cetirizine (ZYRTEC) 10 MG tablet Take by mouth.    . diphenhydrAMINE (BENADRYL) 25 MG tablet Take 25 mg by mouth every 6 (six) hours as needed.    . Ibuprofen (ADVIL LIQUI-GELS MINIS) 200 MG CAPS Take by mouth.    . Azelastine HCl 0.15 % SOLN Place 1-2 sprays into both nostrils 2 (two) times daily as needed. 30 mL 5  . fluticasone (FLOVENT HFA) 110 MCG/ACT inhaler Add 2 puffs 2 times daily during asthma flares until symptoms return to normal 1 Inhaler 3  . levocetirizine (XYZAL) 5 MG tablet Take 1 tablet (5 mg total) by mouth daily as needed for allergies. 30 tablet 5  . PAZEO 0.7 % SOLN Place 1 drop into both eyes daily as needed. 2.5 mL 5   No current facility-administered medications for this visit.     Known medication allergies: Allergies  Allergen Reactions  . Cyclinex [Tetracycline]     rash  . Meloxicam   . Sulfa Antibiotics     rash    I appreciate the opportunity to take part in Jarielys's care. Please do not hesitate to contact me with questions.  Sincerely,   R. Jorene Guestarter Santhosh Gulino, MD

## 2019-02-22 NOTE — Assessment & Plan Note (Signed)
The patient's history suggests severe allergic conjunctivitis, however epicutaneous food and environmental tests were negative despite a positive histamine control and intradermal environmental tests were positive only to [place] grass.  It is possible that she has being exposed to some substance/chemical at her place of work.  Aeroallergen avoidance measures have been discussed and provided in written form.  A prescription has been provided for levocetirizine(Xyzal), 5 mg daily as needed.  A prescription has been provided for Pazeo, one drop per eye daily as needed.  I have also recommended eye lubricant drops (i.e., Natural Tears) as needed.

## 2019-02-22 NOTE — Patient Instructions (Addendum)
Conjunctivitis/periorbital edema The patient's history suggests severe allergic conjunctivitis, however epicutaneous food and environmental tests were negative despite a positive histamine control and intradermal environmental tests were positive only to [place] grass.  It is possible that she has being exposed to some substance/chemical at her place of work.  Aeroallergen avoidance measures have been discussed and provided in written form.  A prescription has been provided for levocetirizine(Xyzal), 5 mg daily as needed.  A prescription has been provided for Pazeo, one drop per eye daily as needed.  I have also recommended eye lubricant drops (i.e., Natural Tears) as needed.  Seasonal allergic rhinitis with a nonallergic component  Aeroallergen avoidance measures have been discussed and provided in written form.  Levocetirizine has been prescribed (as above).  A prescription has been provided for azelastine nasal spray, 1-2 sprays per nostril 2 times daily as needed. Proper nasal spray technique has been discussed and demonstrated.   Nasal saline spray (i.e., Simply Saline) or nasal saline lavage (i.e., NeilMed) is recommended as needed and prior to medicated nasal sprays.  Mild intermittent asthma  Continue albuterol HFA, 1 to 2 inhalations every 4-6 hours if needed.  During asthma flares, add Flovent 110g 2 inhalations 2 times per day until symptoms have returned to baseline.  To maximize pulmonary deposition, a spacer has been provided along with instructions for its proper administration with an HFA inhaler.  Subjective and objective measures of pulmonary function will be followed and the treatment plan will be adjusted accordingly.   Return in about 3 months (around 05/23/2019), or if symptoms worsen or fail to improve.  Reducing Pollen Exposure  The American Academy of Allergy, Asthma and Immunology suggests the following steps to reduce your exposure to pollen during  allergy seasons.    1. Do not hang sheets or clothing out to dry; pollen may collect on these items. 2. Do not mow lawns or spend time around freshly cut grass; mowing stirs up pollen. 3. Keep windows closed at night.  Keep car windows closed while driving. 4. Minimize morning activities outdoors, a time when pollen counts are usually at their highest. 5. Stay indoors as much as possible when pollen counts or humidity is high and on windy days when pollen tends to remain in the air longer. 6. Use air conditioning when possible.  Many air conditioners have filters that trap the pollen spores. 7. Use a HEPA room air filter to remove pollen form the indoor air you breathe.

## 2019-02-22 NOTE — Assessment & Plan Note (Signed)
   Continue albuterol HFA, 1 to 2 inhalations every 4-6 hours if needed.  During asthma flares, add Flovent 110g 2 inhalations 2 times per day until symptoms have returned to baseline.  To maximize pulmonary deposition, a spacer has been provided along with instructions for its proper administration with an HFA inhaler.  Subjective and objective measures of pulmonary function will be followed and the treatment plan will be adjusted accordingly.

## 2019-02-22 NOTE — Assessment & Plan Note (Signed)
   Aeroallergen avoidance measures have been discussed and provided in written form.  Levocetirizine has been prescribed (as above).  A prescription has been provided for azelastine nasal spray, 1-2 sprays per nostril 2 times daily as needed. Proper nasal spray technique has been discussed and demonstrated.   Nasal saline spray (i.e., Simply Saline) or nasal saline lavage (i.e., NeilMed) is recommended as needed and prior to medicated nasal sprays. 

## 2019-02-26 ENCOUNTER — Other Ambulatory Visit: Payer: Self-pay

## 2019-02-26 MED ORDER — AZELASTINE HCL 0.05 % OP SOLN: 1.0000 [drp] | Freq: Two times a day (BID) | OPHTHALMIC | 5 refills | Status: DC

## 2019-02-28 ENCOUNTER — Telehealth: Payer: Self-pay | Admitting: Allergy and Immunology

## 2019-02-28 ENCOUNTER — Other Ambulatory Visit: Payer: Self-pay

## 2019-02-28 MED ORDER — PAZEO 0.7 % OP SOLN: 1.0000 [drp] | OPHTHALMIC | 5 refills | Status: DC

## 2019-02-28 NOTE — Telephone Encounter (Signed)
PT called says the pazeo eye drops were $250 and pharmacist told her that we need to call the insurance to get it covered. Please call pt back

## 2019-02-28 NOTE — Telephone Encounter (Signed)
Changed pt to pazeo

## 2019-04-02 ENCOUNTER — Telehealth: Payer: Self-pay | Admitting: Allergy and Immunology

## 2019-04-02 NOTE — Telephone Encounter (Signed)
She should stop using Lubriderm.  Do warm compresses with wash cloth twice a day for a few minutes.  See her eye doctor if she is not doing better.  She may have blepharitis

## 2019-04-02 NOTE — Telephone Encounter (Signed)
Patient has stopped Lubriderm already. She is currently looking for an eye doctor. She did verbalize understanding of instructions.

## 2019-04-02 NOTE — Telephone Encounter (Signed)
Dr. Beaulah Dinning do you have any advice for patient?

## 2019-04-02 NOTE — Telephone Encounter (Signed)
PT called to speak to Dr. Her eyelids are dry from lash line to eyebrows. Reports red discoloration, really dry with burning sensation. PT using lubriderm lotion on face and it burns. PT washing face with baby shampoo. Pt still using pazeo, levocetirizine and nasal spray. Ph [phone] please leave detailed voicemail if she doesnt answer

## 2019-04-19 ENCOUNTER — Other Ambulatory Visit: Payer: Self-pay

## 2019-04-19 MED ORDER — PAZEO 0.7 % OP SOLN: 1.0000 [drp] | OPHTHALMIC | 5 refills | Status: DC

## 2019-04-20 ENCOUNTER — Other Ambulatory Visit: Payer: Self-pay | Admitting: Allergy and Immunology

## 2019-04-20 MED ORDER — OLOPATADINE HCL 0.2 % OP SOLN: OPHTHALMIC | 5 refills | Status: DC

## 2019-04-20 MED ORDER — OLOPATADINE HCL 0.7 % OP SOLN: 1.0000 [drp] | Freq: Every day | OPHTHALMIC | 5 refills | Status: DC | PRN

## 2019-04-20 NOTE — Addendum Note (Signed)
Addended byClyda Greener M on: 04/20/2019 04:53 PM   Modules accepted: Orders

## 2019-04-20 NOTE — Telephone Encounter (Addendum)
Received fax today from [facility].  Need PA for Pazeo 0.7%.  Per insurance, patient must try three alternatives under covered formulary:   AZELASTINE HCL,CROMOLYN SODIUM,EPINASTINE HCL,KETOTIFEN FUMARATE,OLOPATADINE HCL,ZERVIATE.  Per chart, no documentation patient has tried any other eye drops. Dr Nunzio Cobbs will ok to send in olopatadine HCL (generic Pazeo or Pataday).  Will send in rx for generic.  Called pharmacy.  Generic olopatadine 0.7% was denied.  Olopatadine 0.2% (pataday) is covered.  Sent in rx for olopatadine 0.2%  One drop each eye once a day as needed.

## 2019-04-23 ENCOUNTER — Other Ambulatory Visit: Payer: Self-pay

## 2019-04-23 MED ORDER — OLOPATADINE HCL 0.2 % OP SOLN: 1.0000 [drp] | Freq: Every day | OPHTHALMIC | 5 refills | Status: DC | PRN

## 2019-05-04 ENCOUNTER — Other Ambulatory Visit: Payer: Self-pay

## 2020-06-18 ENCOUNTER — Other Ambulatory Visit: Payer: Self-pay

## 2020-06-18 ENCOUNTER — Encounter: Payer: Self-pay | Admitting: Allergy

## 2020-06-18 ENCOUNTER — Ambulatory Visit: Payer: BC Managed Care – PPO | Admitting: Allergy

## 2020-06-18 VITALS — BP 122/74 | HR 70 | Temp 98.0°F | Resp 16 | Ht 63.39 in | Wt 158.6 lb

## 2020-06-18 DIAGNOSIS — H1013 Acute atopic conjunctivitis, bilateral: Secondary | ICD-10-CM | POA: Diagnosis not present

## 2020-06-18 DIAGNOSIS — J4530 Mild persistent asthma, uncomplicated: Secondary | ICD-10-CM | POA: Diagnosis not present

## 2020-06-18 DIAGNOSIS — J3089 Other allergic rhinitis: Secondary | ICD-10-CM | POA: Diagnosis not present

## 2020-06-18 MED ORDER — BUDESONIDE-FORMOTEROL FUMARATE 160-4.5 MCG/ACT IN AERO: 2.0000 | INHALATION_SPRAY | Freq: Two times a day (BID) | RESPIRATORY_TRACT | 5 refills | Status: DC

## 2020-06-18 MED ORDER — AZELASTINE-FLUTICASONE 137-50 MCG/ACT NA SUSP: NASAL | 5 refills | Status: DC

## 2020-06-18 NOTE — Patient Instructions (Addendum)
Mild persistent asthma  At this time not under good control  Have access to albuterol inhaler 2 puffs every 4-6 hours as needed for cough/wheeze/shortness of breath/chest tightness.  May use 15-20 minutes prior to activity.   Monitor frequency of use.    Maintenance everyday asthma control inhaler:  Symbicort 2 puffs twice a day  Use pump inhalers with spacer device (provided today)  Lung function testing today is normal  Asthma control goals:   Full participation in all desired activities (may need albuterol before activity)  Albuterol use two time or less a week on average (not counting use with activity)  Cough interfering with sleep two time or less a month  Oral steroids no more than once a year  No hospitalizations  Allergic rhinitis with conjunctivitis  Environmetnal allergy testing showed sensitive to grass pollen from 02/2019.  Continue allergen avoidance measures for grass pollen  Continue  levocetirizine(Xyzal), 5 mg daily as needed.  Continue Pataday Xtra or Pataday, one drop per eye daily as needed. This is now over-the-counter  Trial Dymista 1 spray each nostril twice a day.  This is a combination nasal spray with Flonase + Astelin (nasal antihistamine).  This helps with both nasal congestion and drainage.  If not covered by insurance then will prescribe Astelin separately  For proper nasal spray technique point tip of spray bottle towards the ear/eye on same side nostril  Nasal saline spray (i.e., Simply Saline) or nasal saline lavage (i.e., NeilMed) is recommended as needed and prior to medicated nasal sprays.  Follow-up in 3 months or sooner if needed

## 2020-06-18 NOTE — Progress Notes (Signed)
Follow-up Note  RE: Heather Weiss MRN: [id number] DOB: [birth year] Date of Office Visit: 06/18/2020   History of present illness: Heather Weiss is a 50 y.o. female presenting today for follow-up of asthma, allergic rhinitis with conjunctivitis. She was last seen in the office on 02/22/2019 by Dr. Nunzio Cobbs.   She reports being short of breath a lot and has gotten use to it at this point.  She is not sure how long she has had shortness of breath.  She also has noticed occasional wheezing.  Once a while reports a dry cough that seems to be triggered by throat tickle.  Reports once in a while chest tightness. She states at her job if she is assigned to packing at [employer] this does require some physical activity which does worsen her asthma symptoms.  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    At her last visit with Dr. Nunzio Cobbs he recommended that she use Flovent as her maintenance inhaler and albuterol as needed.  However she states she did not get albuterol and only received the Flovent from the pharmacy.  Thus she does not have albuterol and has been using Flovent as albuterol.  Thus she has been using it lately about 3-4 times a week for symptoms.  She states it does not really help to relieve her symptoms and thought her asthma was just very bad.  She does have 1 spacer device that she was using with the Flovent.  She does report nasal congestion, nasal drainage primarily at beginning of summer and typically worse at the end of summer.  She states the L pill (which would be levo cetirizine) seem to work better than Zyrtec.  She states the Pazeo eyedrop was helpful when she would have like the allergic conjunctivitis and swelling of her eyelids.  She states she still has occasional swelling of her eyelids but is not nearly as bad as it was at her initial visit.  It only seems to happen at work.  She states she avoids the  aisle at work where she had the significant eye swelling.  She states she does have the Astelin nasal spray but reports it is likely expired by now.  She is a every smoker and does vape as well.  She states she has tried to quit on many occasions in her most successful attempt was 3 days not smoking.  Review of systems: Review of Systems  Constitutional: Negative.   HENT: Negative.   Eyes: Negative.   Respiratory: Positive for shortness of breath.   Cardiovascular: Negative.   Gastrointestinal: Negative.   Musculoskeletal: Negative.   Skin: Negative.   Neurological: Negative.     All other systems negative unless noted above in HPI  Past medical/social/surgical/family history have been reviewed and are unchanged unless specifically indicated below.  No changes  Medication List: Current Outpatient Medications  Medication Sig Dispense Refill  . azelastine (OPTIVAR) 0.05 % ophthalmic solution Place 1 drop into both eyes 2 (two) times daily. (Patient taking differently: Place 1 drop into both eyes as needed.) 6 mL 5  . Azelastine HCl 0.15 % SOLN Place 1-2 sprays into both nostrils 2 (two) times daily as needed. 30 mL 5  . diphenhydrAMINE (BENADRYL) 25 MG tablet Take 25 mg by mouth every 6 (six) hours as needed.    . fluticasone (FLOVENT HFA) 110 MCG/ACT inhaler Add 2 puffs 2 times  daily during asthma flares until symptoms return to normal 1 Inhaler 3  . albuterol (PROVENTIL HFA;VENTOLIN HFA) 108 (90 Base) MCG/ACT inhaler Inhale 1-2 puffs into the lungs every 6 (six) hours as needed for wheezing or shortness of breath. (Patient not taking: Reported on 06/18/2020) 1 Inhaler 0  . cetirizine (ZYRTEC) 10 MG tablet Take by mouth.    . Ibuprofen 200 MG CAPS Take by mouth. (Patient not taking: Reported on 06/18/2020)    . levocetirizine (XYZAL) 5 MG tablet Take 1 tablet (5 mg total) by mouth daily as needed for allergies. (Patient not taking: Reported on 06/18/2020) 30 tablet 5  . Olopatadine HCl  (PATADAY) 0.2 % SOLN One drop each eye once a day as needed for itchy eyes. (Patient not taking: Reported on 06/18/2020) 2.5 mL 5  . Olopatadine HCl 0.2 % SOLN Place 1 drop into both eyes daily as needed. (Patient not taking: Reported on 06/18/2020) 2.5 mL 5   No current facility-administered medications for this visit.     Known medication allergies: Allergies  Allergen Reactions  . Cyclinex [Tetracycline]     rash  . Meloxicam   . Sulfa Antibiotics     rash     Physical examination: Blood pressure 122/74, pulse 70, temperature 98 F (36.7 C), temperature source Temporal, resp. rate 16, height 5' 3.39" (1.61 m), weight 158 lb 9.6 oz (71.9 kg), SpO2 100 %.  General: Alert, interactive, in no acute distress. HEENT: PERRLA, TMs pearly gray, turbinates non-edematous without discharge, post-pharynx non erythematous. Neck: Supple without lymphadenopathy. Lungs: Clear to auscultation without wheezing, rhonchi or rales. {no increased work of breathing. CV: Normal S1, S2 without murmurs. Abdomen: Nondistended, nontender. Skin: Warm and dry, without lesions or rashes. Extremities:  No clubbing, cyanosis or edema. Neuro:   Grossly intact.  Diagnositics/Labs:  Spirometry: FEV1: 2.61L 96% , FVC: 2.92 L 85% , ratio consistent with Nonobstructive pattern  Assessment and plan:   Mild persistent asthma  At this time not under good control  Have access to albuterol inhaler 2 puffs every 4-6 hours as needed for cough/wheeze/shortness of breath/chest tightness.  May use 15-20 minutes prior to activity.   Monitor frequency of use.    Maintenance everyday asthma control inhaler:  Symbicort 2 puffs twice a day  Use pump inhalers with spacer device (provided today)  Lung function testing today is normal  Asthma control goals:   Full participation in all desired activities (may need albuterol before activity)  Albuterol use two time or less a week on average (not counting use with  activity)  Cough interfering with sleep two time or less a month  Oral steroids no more than once a year  No hospitalizations  Allergic rhinitis with conjunctivitis  Environmetnal allergy testing showed sensitive to grass pollen from 02/2019.  Continue allergen avoidance measures for grass pollen  Continue  levocetirizine(Xyzal), 5 mg daily as needed.  Continue Pataday Xtra or Pataday, one drop per eye daily as needed. This is now over-the-counter  Trial Dymista 1 spray each nostril twice a day.  This is a combination nasal spray with Flonase + Astelin (nasal antihistamine).  This helps with both nasal congestion and drainage.  If not covered by insurance then will prescribe Astelin separately  For proper nasal spray technique point tip of spray bottle towards the ear/eye on same side nostril  Nasal saline spray (i.e., Simply Saline) or nasal saline lavage (i.e., NeilMed) is recommended as needed and prior to medicated nasal sprays.  Follow-up in 3 months or sooner if needed I appreciate the opportunity to take part in Heather Weiss's care. Please do not hesitate to contact me with questions.  Sincerely,   Margo Aye, MD Allergy/Immunology [facility]

## 2020-06-19 ENCOUNTER — Other Ambulatory Visit: Payer: Self-pay

## 2020-06-19 MED ORDER — LEVOCETIRIZINE DIHYDROCHLORIDE 5 MG PO TABS: 5.0000 mg | ORAL_TABLET | Freq: Every day | ORAL | 5 refills | Status: DC | PRN

## 2020-06-20 ENCOUNTER — Telehealth: Payer: Self-pay | Admitting: Allergy

## 2020-06-20 MED ORDER — ALBUTEROL SULFATE HFA 108 (90 BASE) MCG/ACT IN AERS: INHALATION_SPRAY | RESPIRATORY_TRACT | 1 refills | Status: DC

## 2020-06-20 NOTE — Telephone Encounter (Signed)
ERX for Albuterol HFA sent to pharmacy and patient advised.

## 2020-06-20 NOTE — Telephone Encounter (Signed)
   Patient thought when she seen Dr. Delorse Lek she was going to order an albuterol inhaler but she did not get this RX please advise

## 2020-10-01 ENCOUNTER — Ambulatory Visit: Payer: BC Managed Care – PPO | Admitting: Allergy

## 2020-11-07 ENCOUNTER — Ambulatory Visit: Payer: BC Managed Care – PPO | Admitting: Allergy

## 2020-11-07 ENCOUNTER — Other Ambulatory Visit: Payer: Self-pay

## 2020-11-07 ENCOUNTER — Encounter: Payer: Self-pay | Admitting: Allergy

## 2020-11-07 VITALS — BP 124/78 | HR 64 | Temp 98.3°F | Resp 20 | Ht 64.0 in | Wt 163.4 lb

## 2020-11-07 DIAGNOSIS — J3089 Other allergic rhinitis: Secondary | ICD-10-CM

## 2020-11-07 DIAGNOSIS — J4540 Moderate persistent asthma, uncomplicated: Secondary | ICD-10-CM | POA: Diagnosis not present

## 2020-11-07 DIAGNOSIS — H1013 Acute atopic conjunctivitis, bilateral: Secondary | ICD-10-CM

## 2020-11-07 DIAGNOSIS — Z720 Tobacco use: Secondary | ICD-10-CM

## 2020-11-07 MED ORDER — FLUTICASONE FUROATE-VILANTEROL 200-25 MCG/INH IN AEPB: 1.0000 | INHALATION_SPRAY | Freq: Every day | RESPIRATORY_TRACT | 3 refills | Status: AC

## 2020-11-07 MED ORDER — ALBUTEROL SULFATE HFA 108 (90 BASE) MCG/ACT IN AERS: 2.0000 | INHALATION_SPRAY | RESPIRATORY_TRACT | 2 refills | Status: DC | PRN

## 2020-11-07 NOTE — Patient Instructions (Addendum)
Asthma: Daily controller medication(s): START Breo 1 puff once a day and rinse mouth after each use. Sample given and demonstrated proper use. If it's not covered let us know.  Stop Symbicort.  May use albuterol rescue inhaler 2 puffs every 4 to 6 hours as needed for shortness of breath, chest tightness, coughing, and wheezing. May use albuterol rescue inhaler 2 puffs 5 to 15 minutes prior to strenuous physical activities. Monitor frequency of use.  Asthma control goals:  Full participation in all desired activities (may need albuterol before activity) Albuterol use two times or less a week on average (not counting use with activity) Cough interfering with sleep two times or less a month Oral steroids no more than once a year No hospitalizations  Allergic rhino conjunctivitis 2020 skin testing showed positive to grass. Continue environmental control measures. Use over the counter antihistamines such as Zyrtec (cetirizine), Claritin (loratadine), Allegra (fexofenadine), or Xyzal (levocetirizine) daily as needed. May take twice a day during allergy flares. May switch antihistamines every few months. Use olopatadine eye drops 0.2% once a day as needed for itchy/watery eyes. May use dymista (fluticasone + azelastine nasal spray combination) 1 spray per nostril twice a day.  Keep track of eye swelling - if you notice more episodes then let us know.   Follow up in 3 months or sooner if needed.  Reducing Pollen Exposure Pollen seasons: trees (spring), grass (summer) and ragweed/weeds (fall). Keep windows closed in your home and car to lower pollen exposure.  Install air conditioning in the bedroom and throughout the house if possible.  Avoid going out in dry windy days - especially early morning. Pollen counts are highest between 5 - 10 AM and on dry, hot and windy days.  Save outside activities for late afternoon or after a heavy rain, when pollen levels are lower.  Avoid mowing of  grass if you have grass pollen allergy. Be aware that pollen can also be transported indoors on people and pets.  Dry your clothes in an automatic dryer rather than hanging them outside where they might collect pollen.  Rinse hair and eyes before bedtime.

## 2020-11-07 NOTE — Assessment & Plan Note (Signed)
Patient has not been taking Symbicort on a regular basis.  Noticed some difficulty breathing when working in a fast-paced environment.  Using albuterol as needed with good benefit.  She did notice improvement with Symbicort. Smokes 1/2ppd.  ACT score 21.   Today's spirometry was normal but not as good as previous one. . Daily controller medication(s): START Breo 1 puff once a day and rinse mouth after each use - hopefully the once a day dosing will improve compliance. o Sample given and demonstrated proper use. o If it's not covered let us know.  o Stop Symbicort.  . May use albuterol rescue inhaler 2 puffs every 4 to 6 hours as needed for shortness of breath, chest tightness, coughing, and wheezing. May use albuterol rescue inhaler 2 puffs 5 to 15 minutes prior to strenuous physical activities. Monitor frequency of use.  . Get spirometry at next visit. . If doing better at next visit will step down to Breo dose. Marland Kitchen Discussed smoking cessation.

## 2020-11-07 NOTE — Assessment & Plan Note (Signed)
No recent episodes but used to have periorbital swelling at times. See assessment and plan as above. . Use olopatadine eye drops 0.2% once a day as needed for itchy/watery eyes. Marland Kitchen Keep track of eye swelling - if you notice more episodes then let us know.

## 2020-11-07 NOTE — Assessment & Plan Note (Addendum)
Past history - 2020 skin testing showed positive to grass. Interim history - only takes Xyzal as needed. . Continue environmental control measures. . Use over the counter antihistamines such as Zyrtec (cetirizine), Claritin (loratadine), Allegra (fexofenadine), or Xyzal (levocetirizine) daily as needed. May take twice a day during allergy flares. May switch antihistamines every few months. . May use dymista (fluticasone + azelastine nasal spray combination) 1 spray per nostril twice a day.

## 2020-11-07 NOTE — Progress Notes (Signed)
Follow Up Note  RE: Heather Weiss MRN: [id number] DOB: 1971-02-19 Date of Office Visit: 11/07/2020  Referring provider: Maud Deed, PA Primary care provider: Maud Deed, [place]  Chief Complaint: Asthma  History of Present Illness: I had the pleasure of seeing Heather Weiss for a follow up visit at the [facility] on 11/07/2020. She is a 50 y.o. female, who is being followed for asthma and allergic rhinoconjunctivitis. Her previous allergy office visit was on 06/18/2020 with Dr. Delorse Lek. Today is a regular follow up visit.  Asthma  Sometimes has episodes of trouble catching her breath and difficulty taking a deep breath in. This occurs mostly when she's at work - she works at [employer] which is a very fast pace environment.  Currently not taking Symbicort daily and last time she took it was a few weeks ago.  She does notice improvement when she is taking the inhaler but forgets to take it.    Usually smokes 1/2 ppd.   Allergic rhinitis with conjunctivitis Issues when around freshly mowed grass.  Only taking Xyzal as needed - last time was about 2 weeks ago. She has not used her eye drops in awhile.   Patient is not sure what nasal spray she is using at home but did not try dymista yet.  Assessment and Plan: Heather Weiss is a 50 y.o. female with: Not well controlled moderate persistent asthma Patient has not been taking Symbicort on a regular basis.  Noticed some difficulty breathing when working in a fast-paced environment.  Using albuterol as needed with good benefit.  She did notice improvement with Symbicort. Smokes 1/2ppd. ACT score 21.  Today's spirometry was normal but not as good as previous one. Daily controller medication(s): START Breo 1 puff once a day and rinse mouth after each use - hopefully the once a day dosing will improve compliance. Sample given and demonstrated proper use. If it's not covered let us know.  Stop Symbicort.  May  use albuterol rescue inhaler 2 puffs every 4 to 6 hours as needed for shortness of breath, chest tightness, coughing, and wheezing. May use albuterol rescue inhaler 2 puffs 5 to 15 minutes prior to strenuous physical activities. Monitor frequency of use.  Get spirometry at next visit. If doing better at next visit will step down to Breo dose. Discussed smoking cessation.  Other allergic rhinitis Past history - 2020 skin testing showed positive to grass. Interim history - only takes Xyzal as needed. Continue environmental control measures. Use over the counter antihistamines such as Zyrtec (cetirizine), Claritin (loratadine), Allegra (fexofenadine), or Xyzal (levocetirizine) daily as needed. May take twice a day during allergy flares. May switch antihistamines every few months. May use dymista (fluticasone + azelastine nasal spray combination) 1 spray per nostril twice a day.  Conjunctivitis/periorbital edema No recent episodes but used to have periorbital swelling at times. See assessment and plan as above. Use olopatadine eye drops 0.2% once a day as needed for itchy/watery eyes. Keep track of eye swelling - if you notice more episodes then let us know.   Return in about 3 months (around 02/07/2021).  Meds ordered this encounter  Medications   fluticasone furoate-vilanterol (BREO ELLIPTA) 200-25 MCG/INH AEPB    Sig: Inhale 1 puff into the lungs daily. Rinse mouth after each use.    Dispense:  60 each    Refill:  3    Hold rx until patient ready for pick up   albuterol (PROAIR HFA) 108 (  90 Base) MCG/ACT inhaler    Sig: Inhale 2 puffs into the lungs every 4 (four) hours as needed for wheezing or shortness of breath (coughing fits).    Dispense:  18 g    Refill:  2    Lab Orders  No laboratory test(s) ordered today    Diagnostics: Spirometry:  Tracings reviewed. Her effort: Good reproducible efforts. FVC: 3.02L FEV1: 2.36L, 85% predicted FEV1/FVC ratio:  78% Interpretation: Spirometry consistent with normal pattern.  Please see scanned spirometry results for details.  Medication List:  Current Outpatient Medications  Medication Sig Dispense Refill   albuterol (PROAIR HFA) 108 (90 Base) MCG/ACT inhaler Inhale 2 puffs into the lungs every 4 (four) hours as needed for wheezing or shortness of breath (coughing fits). 18 g 2   Azelastine HCl 0.15 % SOLN Place 1-2 sprays into both nostrils 2 (two) times daily as needed. 30 mL 5   baclofen (LIORESAL) 10 MG tablet Take 10 mg by mouth 3 (three) times daily.     diclofenac (VOLTAREN) 75 MG EC tablet Take by mouth.     diphenhydrAMINE (BENADRYL) 25 MG tablet Take 25 mg by mouth every 6 (six) hours as needed.     fluticasone furoate-vilanterol (BREO ELLIPTA) 200-25 MCG/INH AEPB Inhale 1 puff into the lungs daily. Rinse mouth after each use. 60 each 3   levocetirizine (XYZAL) 5 MG tablet Take 1 tablet (5 mg total) by mouth daily as needed for allergies. 30 tablet 5   Olopatadine HCl 0.2 % SOLN Place 1 drop into both eyes daily as needed. 2.5 mL 5   terbinafine (LAMISIL) 250 MG tablet Take by mouth.     Azelastine-Fluticasone 137-50 MCG/ACT SUSP 1 spray per nostril twice a day for runny nose 23 g 5   Ibuprofen 200 MG CAPS Take by mouth. (Patient not taking: Reported on 11/07/2020)     No current facility-administered medications for this visit.   Allergies: Allergies  Allergen Reactions   Cyclinex [Tetracycline]     rash   Meloxicam    Sulfa Antibiotics     rash   I reviewed her past medical history, social history, family history, and environmental history and no significant changes have been reported from her previous visit.  Review of Systems  Constitutional:  Negative for appetite change, chills, fever and unexpected weight change.  HENT:  Negative for congestion and rhinorrhea.   Eyes:  Negative for itching.  Respiratory:  Positive for shortness of breath. Negative for cough, chest  tightness and wheezing.   Gastrointestinal:  Negative for abdominal pain.  Skin:  Negative for rash.  Allergic/Immunologic: Positive for environmental allergies.  Neurological:  Negative for headaches.   Objective: BP 124/78 (BP Location: Left Arm, Patient Position: Sitting, Cuff Size: Normal)   Pulse 64   Temp 98.3 F (36.8 C) (Temporal)   Resp 20   Ht 5\' 4"  (1.626 m)   Wt 163 lb 6.4 oz (74.1 kg)   SpO2 100%   BMI 28.05 kg/m  Body mass index is 28.05 kg/m. Physical Exam Vitals and nursing note reviewed.  Constitutional:      Appearance: Normal appearance. She is well-developed.  HENT:     Head: Normocephalic and atraumatic.     Right Ear: External ear normal.     Left Ear: External ear normal.     Nose: Nose normal.     Mouth/Throat:     Mouth: Mucous membranes are moist.     Pharynx: Oropharynx is clear.  Eyes:     Conjunctiva/sclera: Conjunctivae normal.  Cardiovascular:     Rate and Rhythm: Normal rate and regular rhythm.     Heart sounds: Normal heart sounds. No murmur heard. Pulmonary:     Effort: Pulmonary effort is normal.     Breath sounds: Normal breath sounds. No wheezing, rhonchi or rales.  Musculoskeletal:     Cervical back: Neck supple.  Skin:    General: Skin is warm.     Findings: No rash.  Neurological:     Mental Status: She is alert and oriented to person, place, and time.  Psychiatric:        Behavior: Behavior normal.  Previous notes and tests were reviewed. The plan was reviewed with the patient/family, and all questions/concerned were addressed.  It was my pleasure to see Evoleth today and participate in her care. Please feel free to contact me with any questions or concerns.  Sincerely,  Wyline Mood, DO Allergy & Immunology  [facility] office: [phone] [facility] office: [phone]

## 2021-09-24 NOTE — Patient Instructions (Addendum)
Moderate persistent asthma: Congratulations on quitting smoking cigarettes!  Recommend stopping vaping. Daily controller medication(s): Continue Breo 1 puff once a day and rinse mouth after each use.  Consider decreasing to Breo 100 mcg at next office visit if doing well. May use albuterol rescue inhaler 2 puffs every 4 to 6 hours as needed for shortness of breath, chest tightness, coughing, and wheezing. May use albuterol rescue inhaler 2 puffs 5 to 15 minutes prior to strenuous physical activities. Monitor frequency of use.  Asthma control goals:  Full participation in all desired activities (may need albuterol before activity) Albuterol use two times or less a week on average (not counting use with activity) Cough interfering with sleep two times or less a month Oral steroids no more than once a year No hospitalizations  Allergic rhinoconjunctivitis 2020 skin testing showed positive to grass. Continue environmental control measures. Use over the counter antihistamines such as Zyrtec (cetirizine), Claritin (loratadine), Allegra (fexofenadine), or Xyzal (levocetirizine) daily as needed. May take twice a day during allergy flares. May switch antihistamines every few months. Use olopatadine eye drops 0.2% once a day as needed for itchy/watery eyes. May use dymista (fluticasone + azelastine nasal spray combination) 1 spray per nostril twice a day. Recommend stop using alcohol to clean out your nose when you are sneezing and is itchy.  You can try using saline spray or saline rinse.  Keep track of eye swelling - if you notice more episodes then let us know. (None since last office visit)  Heartburn -Start a 30-day trial of omeprazole 20 mg once a day 30 minutes before dinner since you are having symptoms almost daily.  After your 30-day trial is up give our office a call and let us know if this medication has helped.  If this medication is helped we will try decreasing you to Pepcid.  If  you are still having symptoms while on omeprazole we will refer you to GI. -Start dietary and lifestyle modifications as below. Recommend decreasing caffeine consumption  Follow up in 4-6 months or sooner if needed.  Reducing Pollen Exposure Pollen seasons: trees (spring), grass (summer) and ragweed/weeds (fall). Keep windows closed in your home and car to lower pollen exposure.  Install air conditioning in the bedroom and throughout the house if possible.  Avoid going out in dry windy days - especially early morning. Pollen counts are highest between 5 - 10 AM and on dry, hot and windy days.  Save outside activities for late afternoon or after a heavy rain, when pollen levels are lower.  Avoid mowing of grass if you have grass pollen allergy. Be aware that pollen can also be transported indoors on people and pets.  Dry your clothes in an automatic dryer rather than hanging them outside where they might collect pollen.  Rinse hair and eyes before bedtime.   Lifestyle Changes for Controlling GERD When you have GERD, stomach acid feels as if it's backing up toward your mouth. Whether or not you take medication to control your GERD, your symptoms can often be improved with lifestyle changes.   Raise Your Head Reflux is more likely to strike when you're lying down flat, because stomach fluid can flow backward more easily. Raising the head of your bed 4-6 inches can help. To do this: Slide blocks or books under the legs at the head of your bed. Or, place a wedge under the mattress. Many foam stores can make a suitable wedge for you. The wedge should run  from your waist to the top of your head. Don't just prop your head on several pillows. This increases pressure on your stomach. It can make GERD worse.  Watch Your Eating Habits Certain foods may increase the acid in your stomach or relax the lower esophageal sphincter, making GERD more likely. It's best to avoid the following: Coffee,  tea, and carbonated drinks (with and without caffeine) Fatty, fried, or spicy food Mint, chocolate, onions, and tomatoes Any other foods that seem to irritate your stomach or cause you pain  Relieve the Pressure Eat smaller meals, even if you have to eat more often. Don't lie down right after you eat. Wait a few hours for your stomach to empty. Avoid tight belts and tight-fitting clothes. Lose excess weight.  Tobacco and Alcohol Avoid smoking tobacco and drinking alcohol. They can make GERD symptoms worse.

## 2021-09-25 ENCOUNTER — Ambulatory Visit: Payer: BC Managed Care – PPO | Admitting: Family

## 2021-09-25 ENCOUNTER — Encounter: Payer: Self-pay | Admitting: Family

## 2021-09-25 VITALS — BP 120/70 | HR 77 | Temp 97.9°F | Resp 16 | Ht 65.0 in | Wt 167.4 lb

## 2021-09-25 DIAGNOSIS — R12 Heartburn: Secondary | ICD-10-CM

## 2021-09-25 DIAGNOSIS — H1013 Acute atopic conjunctivitis, bilateral: Secondary | ICD-10-CM | POA: Diagnosis not present

## 2021-09-25 DIAGNOSIS — J3089 Other allergic rhinitis: Secondary | ICD-10-CM | POA: Diagnosis not present

## 2021-09-25 DIAGNOSIS — J4540 Moderate persistent asthma, uncomplicated: Secondary | ICD-10-CM | POA: Diagnosis not present

## 2021-09-25 MED ORDER — BREO ELLIPTA 200-25 MCG/ACT IN AEPB: 1.0000 | INHALATION_SPRAY | Freq: Every day | RESPIRATORY_TRACT | 5 refills | Status: AC

## 2021-09-25 MED ORDER — LEVOCETIRIZINE DIHYDROCHLORIDE 5 MG PO TABS: 5.0000 mg | ORAL_TABLET | Freq: Every day | ORAL | 5 refills | Status: AC | PRN

## 2021-09-25 MED ORDER — OMEPRAZOLE 20 MG PO CPDR: DELAYED_RELEASE_CAPSULE | ORAL | 0 refills | Status: DC

## 2021-09-25 MED ORDER — OLOPATADINE HCL 0.2 % OP SOLN: 1.0000 [drp] | Freq: Every day | OPHTHALMIC | 5 refills | Status: AC | PRN

## 2021-09-25 MED ORDER — ALBUTEROL SULFATE HFA 108 (90 BASE) MCG/ACT IN AERS: 2.0000 | INHALATION_SPRAY | RESPIRATORY_TRACT | 1 refills | Status: AC | PRN

## 2021-09-25 NOTE — Progress Notes (Signed)
[address] [place] [address] Dept: [phone]  FOLLOW UP NOTE  Patient ID: Heather Weiss, female    DOB: [birth year]  Age: 51 y.o. MRN: [id number] Date of Office Visit: 09/25/2021  Assessment  Chief Complaint: Asthma  HPI Heather Weiss is a 51 year old female who presents today for follow-up of not well controlled moderate persistent asthma, allergic rhinitis, and conjunctivitis/periorbital edema.  She was last seen on November 07, 2020 by Dr. Selena Batten.  Since her last office visit she denies any new diagnosis or surgeries.  Moderate persistent asthma: She continues to take Breo 200 mcg 1 puff once a day and has albuterol to use as needed.  She reports if she runs out of medication for a week or so she does notice a difference in her breathing.  She will feel like it is a little bit harder to breathe.  The only time she does not take her medicine is when she has to get a refill and it takes a few days for her to pick up with her busy work schedule.  She reports shortness of breath only when allergies kick in or with exertion.  She denies wheezing, coughing, tightness in chest, and nocturnal awakenings due to breathing problems.  She was given 1 round of steroids on January 16, 2021 for a sinus infection and asthma flare.  She reports that her albuterol use is way less since starting Breo.  She reports very rare use of her albuterol inhaler.  She quit smoking cigarettes on March 1 and now vapes.  She is wanting to quit vaping.  Allergic rhinitis: She reports to have allergies to grass.  She will have sneezing and postnasal drip at times, but this is not very much.  She will sometimes put alcohol on a Q-tip in her nose when her nose is itching or she is sneezing a lot.  Recommended stop using alcohol in her nose and to try saline nasal spray or saline nasal rinse in its place.  She denies rhinorrhea and nasal congestion.  She did have one sinus infection since her last office visit and she  also was diagnosed with otitis externa in November 2022.  She has not had any more episodes of periorbital edema since her last office visit.  She does report occasional itchy eyes at times.     Drug Allergies:  Allergies  Allergen Reactions   Cyclinex [Tetracycline]     rash   Meloxicam    Sulfa Antibiotics     rash    Review of Systems: Review of Systems  Constitutional:  Negative for chills and fever.  Eyes:        Reports itchy eyes at times.denies any periorbital edema since her last office visit  Respiratory:  Negative for cough and wheezing.        Reports shortness of breath only if allergies are kicking in or with exertion.  Denies coughing, tightness in chest, wheezing, and nocturnal awakenings due to breathing problems  Cardiovascular:  Negative for chest pain and palpitations.  Gastrointestinal:  Positive for heartburn.       Reports heartburn almost every day.  She will have heartburn if she drinks too much water.  She takes Tums.  She has not seen GI before.  She was told by friends to try over-the-counter Prilosec, but did not due to the cost being $20.  Skin:  Negative for itching and rash.  Neurological:  Positive for headaches.  Reports headaches with caffeine withdrawal  Endo/Heme/Allergies:  Positive for environmental allergies.     Physical Exam: BP 120/70   Pulse 77   Temp 97.9 F (36.6 C) (Temporal)   Resp 16   Ht 5\' 5"  (1.651 m)   Wt 167 lb 6.4 oz (75.9 kg)   SpO2 98%   BMI 27.86 kg/m    Physical Exam Constitutional:      Appearance: Normal appearance.  HENT:     Head: Normocephalic and atraumatic.     Comments: Pharynx normal, eyes normal, ears normal, nose normal    Right Ear: Tympanic membrane, ear canal and external ear normal.     Left Ear: Tympanic membrane, ear canal and external ear normal.     Nose: Nose normal.     Mouth/Throat:     Mouth: Mucous membranes are moist.     Pharynx: Oropharynx is clear.  Eyes:      Conjunctiva/sclera: Conjunctivae normal.  Cardiovascular:     Rate and Rhythm: Regular rhythm.     Heart sounds: Normal heart sounds.  Pulmonary:     Effort: Pulmonary effort is normal.     Breath sounds: Normal breath sounds.     Comments: Lungs clear to auscultation Musculoskeletal:     Cervical back: Neck supple.  Skin:    General: Skin is warm.  Neurological:     Mental Status: She is alert and oriented to person, place, and time.  Psychiatric:        Mood and Affect: Mood normal.        Behavior: Behavior normal.        Thought Content: Thought content normal.        Judgment: Judgment normal.     Diagnostics: FVC 3.03 L (85%), FEV1 2.58 L (91%).  Predicted FVC 3.56 L, predicted FEV1 2.84 L.  Spirometry indicates normal respiratory function.  Assessment and Plan: 1. Moderate persistent asthma without complication   2. Other allergic rhinitis   3. Allergic conjunctivitis of both eyes   4. Heartburn     Meds ordered this encounter  Medications   omeprazole (PRILOSEC) 20 MG capsule    Sig: Take 1 capsule 30 minutes before dinner each night    Dispense:  30 capsule    Refill:  0   albuterol (VENTOLIN HFA) 108 (90 Base) MCG/ACT inhaler    Sig: Inhale 2 puffs into the lungs every 4 (four) hours as needed for wheezing or shortness of breath.    Dispense:  8 g    Refill:  1   levocetirizine (XYZAL) 5 MG tablet    Sig: Take 1 tablet (5 mg total) by mouth daily as needed for allergies.    Dispense:  30 tablet    Refill:  5   BREO ELLIPTA 200-25 MCG/ACT AEPB    Sig: Inhale 1 puff into the lungs daily.    Dispense:  28 each    Refill:  5   Olopatadine HCl 0.2 % SOLN    Sig: Place 1 drop into both eyes daily as needed.    Dispense:  2.5 mL    Refill:  5    Patient Instructions  Moderate persistent asthma: Congratulations on quitting smoking cigarettes!  Recommend stopping vaping. Daily controller medication(s): Continue Breo 276mcg 1 puff once a day and rinse mouth  after each use.  Consider decreasing to Breo 100 mcg at next office visit if doing well. May use albuterol rescue inhaler 2 puffs every 4 to  6 hours as needed for shortness of breath, chest tightness, coughing, and wheezing. May use albuterol rescue inhaler 2 puffs 5 to 15 minutes prior to strenuous physical activities. Monitor frequency of use.  Asthma control goals:  Full participation in all desired activities (may need albuterol before activity) Albuterol use two times or less a week on average (not counting use with activity) Cough interfering with sleep two times or less a month Oral steroids no more than once a year No hospitalizations  Allergic rhinoconjunctivitis 2020 skin testing showed positive to grass. Continue environmental control measures. Use over the counter antihistamines such as Zyrtec (cetirizine), Claritin (loratadine), Allegra (fexofenadine), or Xyzal (levocetirizine) daily as needed. May take twice a day during allergy flares. May switch antihistamines every few months. Use olopatadine eye drops 0.2% once a day as needed for itchy/watery eyes. May use dymista (fluticasone + azelastine nasal spray combination) 1 spray per nostril twice a day. Recommend stop using alcohol to clean out your nose when you are sneezing and is itchy.  You can try using saline spray or saline rinse.  Keep track of eye swelling - if you notice more episodes then let us know. (None since last office visit)  Heartburn -Start a 30-day trial of omeprazole 20 mg once a day 30 minutes before dinner since you are having symptoms almost daily.  After your 30-day trial is up give our office a call and let us know if this medication has helped.  If this medication is helped we will try decreasing you to Pepcid.  If you are still having symptoms while on omeprazole we will refer you to GI. -Start dietary and lifestyle modifications as below. Recommend decreasing caffeine consumption  Follow up in 4-6  months or sooner if needed.  Reducing Pollen Exposure Pollen seasons: trees (spring), grass (summer) and ragweed/weeds (fall). Keep windows closed in your home and car to lower pollen exposure.  Install air conditioning in the bedroom and throughout the house if possible.  Avoid going out in dry windy days - especially early morning. Pollen counts are highest between 5 - 10 AM and on dry, hot and windy days.  Save outside activities for late afternoon or after a heavy rain, when pollen levels are lower.  Avoid mowing of grass if you have grass pollen allergy. Be aware that pollen can also be transported indoors on people and pets.  Dry your clothes in an automatic dryer rather than hanging them outside where they might collect pollen.  Rinse hair and eyes before bedtime.   Lifestyle Changes for Controlling GERD When you have GERD, stomach acid feels as if it's backing up toward your mouth. Whether or not you take medication to control your GERD, your symptoms can often be improved with lifestyle changes.   Raise Your Head Reflux is more likely to strike when you're lying down flat, because stomach fluid can flow backward more easily. Raising the head of your bed 4-6 inches can help. To do this: Slide blocks or books under the legs at the head of your bed. Or, place a wedge under the mattress. Many foam stores can make a suitable wedge for you. The wedge should run from your waist to the top of your head. Don't just prop your head on several pillows. This increases pressure on your stomach. It can make GERD worse.  Watch Your Eating Habits Certain foods may increase the acid in your stomach or relax the lower esophageal sphincter, making GERD more likely. It's  best to avoid the following: Coffee, tea, and carbonated drinks (with and without caffeine) Fatty, fried, or spicy food Mint, chocolate, onions, and tomatoes Any other foods that seem to irritate your stomach or cause you  pain  Relieve the Pressure Eat smaller meals, even if you have to eat more often. Don't lie down right after you eat. Wait a few hours for your stomach to empty. Avoid tight belts and tight-fitting clothes. Lose excess weight.  Tobacco and Alcohol Avoid smoking tobacco and drinking alcohol. They can make GERD symptoms worse.  Return in about 6 months (around 03/28/2022), or if symptoms worsen or fail to improve.    Thank you for the opportunity to care for this patient.  Please do not hesitate to contact me with questions.  Althea Charon, FNP [facility]

## 2021-11-25 ENCOUNTER — Other Ambulatory Visit: Payer: Self-pay | Admitting: Family
